# Patient Record
Sex: Male | Born: 1945 | Race: Asian | Hispanic: No | Marital: Single | Smoking: Never smoker
Health system: Southern US, Community
[De-identification: ages and names within clinical notes are randomized; demographics above are authoritative.]

---

## 2018-09-28 ENCOUNTER — Inpatient Hospital Stay
Admission: EM | Admit: 2018-09-28 | Discharge: 2018-09-30 | DRG: 247 | Disposition: A | Discharge status: Home / Self Care | Payer: PRIVATE HEALTH INSURANCE | Attending: Internal Medicine | Admitting: Internal Medicine

## 2018-09-28 ENCOUNTER — Inpatient Hospital Stay
Admit: 2018-09-28 | Discharge: 2018-09-28 | Disposition: A | Discharge status: Home / Self Care | Payer: PRIVATE HEALTH INSURANCE | Attending: Internal Medicine | Admitting: Internal Medicine

## 2018-09-28 ENCOUNTER — Emergency Department: Payer: PRIVATE HEALTH INSURANCE

## 2018-09-28 ENCOUNTER — Encounter: Payer: Self-pay | Admitting: Emergency Medicine

## 2018-09-28 ENCOUNTER — Other Ambulatory Visit: Payer: Self-pay

## 2018-09-28 DIAGNOSIS — I249 Acute ischemic heart disease, unspecified: Secondary | ICD-10-CM | POA: Diagnosis present

## 2018-09-28 DIAGNOSIS — E785 Hyperlipidemia, unspecified: Secondary | ICD-10-CM | POA: Diagnosis present

## 2018-09-28 DIAGNOSIS — I255 Ischemic cardiomyopathy: Secondary | ICD-10-CM | POA: Diagnosis present

## 2018-09-28 DIAGNOSIS — I160 Hypertensive urgency: Secondary | ICD-10-CM | POA: Diagnosis present

## 2018-09-28 DIAGNOSIS — I2129 ST elevation (STEMI) myocardial infarction involving other sites: Secondary | ICD-10-CM | POA: Diagnosis present

## 2018-09-28 DIAGNOSIS — Z79899 Other long term (current) drug therapy: Secondary | ICD-10-CM

## 2018-09-28 DIAGNOSIS — R079 Chest pain, unspecified: Secondary | ICD-10-CM | POA: Diagnosis present

## 2018-09-28 DIAGNOSIS — Z955 Presence of coronary angioplasty implant and graft: Secondary | ICD-10-CM | POA: Diagnosis not present

## 2018-09-28 DIAGNOSIS — I10 Essential (primary) hypertension: Secondary | ICD-10-CM | POA: Diagnosis present

## 2018-09-28 LAB — CBC WITH DIFFERENTIAL/PLATELET
Abs Immature Granulocytes: 0.09 10*3/uL — ABNORMAL HIGH (ref 0.00–0.07)
Basophils Absolute: 0.1 10*3/uL (ref 0.0–0.1)
Basophils Relative: 0 %
Eosinophils Absolute: 0.1 10*3/uL (ref 0.0–0.5)
Eosinophils Relative: 0 %
HCT: 48.5 % (ref 39.0–52.0)
Hemoglobin: 16.7 g/dL (ref 13.0–17.0)
Immature Granulocytes: 1 %
Lymphocytes Relative: 13 %
Lymphs Abs: 1.8 10*3/uL (ref 0.7–4.0)
MCH: 28.7 pg (ref 26.0–34.0)
MCHC: 34.4 g/dL (ref 30.0–36.0)
MCV: 83.5 fL (ref 80.0–100.0)
Monocytes Absolute: 1 10*3/uL (ref 0.1–1.0)
Monocytes Relative: 7 %
Neutro Abs: 10.7 10*3/uL — ABNORMAL HIGH (ref 1.7–7.7)
Neutrophils Relative %: 79 %
Platelets: 236 10*3/uL (ref 150–400)
RBC: 5.81 MIL/uL (ref 4.22–5.81)
RDW: 13.2 % (ref 11.5–15.5)
WBC: 13.7 10*3/uL — ABNORMAL HIGH (ref 4.0–10.5)
nRBC: 0 % (ref 0.0–0.2)

## 2018-09-28 LAB — LIPASE, BLOOD: Lipase: 30 U/L (ref 11–51)

## 2018-09-28 LAB — COMPREHENSIVE METABOLIC PANEL
ALT: 29 U/L (ref 0–44)
AST: 117 U/L — ABNORMAL HIGH (ref 15–41)
Albumin: 4.6 g/dL (ref 3.5–5.0)
Alkaline Phosphatase: 105 U/L (ref 38–126)
Anion gap: 12 (ref 5–15)
BUN: 12 mg/dL (ref 8–23)
CO2: 26 mmol/L (ref 22–32)
Calcium: 10 mg/dL (ref 8.9–10.3)
Chloride: 99 mmol/L (ref 98–111)
Creatinine, Ser: 0.99 mg/dL (ref 0.61–1.24)
GFR calc Af Amer: >60 mL/min (ref >60)
GFR calc non Af Amer: >60 mL/min (ref >60)
Glucose, Bld: 145 mg/dL — ABNORMAL HIGH (ref 70–99)
Potassium: 3.9 mmol/L (ref 3.5–5.1)
Sodium: 137 mmol/L (ref 135–145)
Total Bilirubin: 0.7 mg/dL (ref 0.3–1.2)
Total Protein: 9.5 g/dL — ABNORMAL HIGH (ref 6.5–8.1)

## 2018-09-28 LAB — LIPID PANEL
Cholesterol: 178 mg/dL (ref 0–200)
HDL: 33 mg/dL — ABNORMAL LOW (ref >40)
LDL Cholesterol: 129 mg/dL — ABNORMAL HIGH (ref 0–99)
Total CHOL/HDL Ratio: 5.4 RATIO
Triglycerides: 82 mg/dL (ref <150)
VLDL: 16 mg/dL (ref 0–40)

## 2018-09-28 LAB — TROPONIN I
Troponin I: 6.78 ng/mL (ref <0.03)
Troponin I: 7.81 ng/mL (ref <0.03)
Troponin I: 14.87 ng/mL (ref <0.03)

## 2018-09-28 LAB — TSH: TSH: 4.019 u[IU]/mL (ref 0.350–4.500)

## 2018-09-28 LAB — PROTIME-INR
INR: 1 (ref 0.8–1.2)
Prothrombin Time: 13.4 seconds (ref 11.4–15.2)

## 2018-09-28 LAB — HEPARIN LEVEL (UNFRACTIONATED): Heparin Unfractionated: 0.4 IU/mL (ref 0.30–0.70)

## 2018-09-28 LAB — APTT: aPTT: 32 seconds (ref 24–36)

## 2018-09-28 MED ORDER — LOSARTAN POTASSIUM 50 MG PO TABS
50.0000 mg | ORAL_TABLET | Freq: Every day | ORAL | Status: DC
  Administered 2018-09-30: 50 mg via ORAL
  Filled 2018-09-28: qty 1

## 2018-09-28 MED ORDER — SODIUM CHLORIDE 0.9% FLUSH: 3.0000 mL | INTRAVENOUS | Status: DC | PRN

## 2018-09-28 MED ORDER — SODIUM CHLORIDE 0.9 % IV BOLUS: 500.0000 mL | Freq: Once | INTRAVENOUS | Status: DC

## 2018-09-28 MED ORDER — ASPIRIN 81 MG PO CHEW
81.0000 mg | CHEWABLE_TABLET | ORAL | Status: AC
  Administered 2018-09-29: 81 mg via ORAL
  Filled 2018-09-28: qty 1

## 2018-09-28 MED ORDER — ALUM & MAG HYDROXIDE-SIMETH 200-200-20 MG/5ML PO SUSP
30.0000 mL | Freq: Four times a day (QID) | ORAL | Status: DC
  Administered 2018-09-28 – 2018-09-30 (×6): 30 mL via ORAL
  Filled 2018-09-28 (×6): qty 30

## 2018-09-28 MED ORDER — ONDANSETRON HCL 4 MG/2ML IJ SOLN
4.0000 mg | Freq: Four times a day (QID) | INTRAMUSCULAR | Status: DC | PRN
  Administered 2018-09-29: 4 mg via INTRAVENOUS
  Filled 2018-09-28: qty 2

## 2018-09-28 MED ORDER — METOPROLOL TARTRATE 50 MG PO TABS
100.0000 mg | ORAL_TABLET | Freq: Two times a day (BID) | ORAL | Status: DC
  Administered 2018-09-28 – 2018-09-30 (×3): 100 mg via ORAL
  Filled 2018-09-28 (×3): qty 2

## 2018-09-28 MED ORDER — METOPROLOL TARTRATE 25 MG PO TABS
25.0000 mg | ORAL_TABLET | Freq: Once | ORAL | Status: AC
  Administered 2018-09-28: 25 mg via ORAL
  Filled 2018-09-28: qty 1

## 2018-09-28 MED ORDER — HYDRALAZINE HCL 20 MG/ML IJ SOLN: 10.0000 mg | Freq: Four times a day (QID) | INTRAMUSCULAR | Status: DC | PRN

## 2018-09-28 MED ORDER — SODIUM CHLORIDE 0.9 % WEIGHT BASED INFUSION
1.0000 mL/kg/h | INTRAVENOUS | Status: DC
  Administered 2018-09-29: 1 mL/kg/h via INTRAVENOUS

## 2018-09-28 MED ORDER — PANTOPRAZOLE SODIUM 40 MG PO TBEC
40.0000 mg | DELAYED_RELEASE_TABLET | Freq: Every day | ORAL | Status: DC
  Administered 2018-09-28 – 2018-09-30 (×2): 40 mg via ORAL
  Filled 2018-09-28 (×2): qty 1

## 2018-09-28 MED ORDER — SODIUM CHLORIDE 0.9 % WEIGHT BASED INFUSION
3.0000 mL/kg/h | INTRAVENOUS | Status: DC
  Administered 2018-09-29: 3 mL/kg/h via INTRAVENOUS

## 2018-09-28 MED ORDER — ONDANSETRON HCL 4 MG/2ML IJ SOLN
4.0000 mg | Freq: Once | INTRAMUSCULAR | Status: AC
  Administered 2018-09-28: 4 mg via INTRAVENOUS
  Filled 2018-09-28: qty 2

## 2018-09-28 MED ORDER — LABETALOL HCL 5 MG/ML IV SOLN
10.0000 mg | Freq: Once | INTRAVENOUS | Status: AC
  Administered 2018-09-28: 14:00:00 10 mg via INTRAVENOUS
  Filled 2018-09-28: qty 4

## 2018-09-28 MED ORDER — ASPIRIN EC 81 MG PO TBEC
81.0000 mg | DELAYED_RELEASE_TABLET | Freq: Every day | ORAL | Status: DC
  Administered 2018-09-30: 81 mg via ORAL
  Filled 2018-09-28: qty 1

## 2018-09-28 MED ORDER — NITROGLYCERIN 0.4 MG SL SUBL
0.4000 mg | SUBLINGUAL_TABLET | SUBLINGUAL | Status: DC | PRN
  Administered 2018-09-29: 0.4 mg via SUBLINGUAL
  Filled 2018-09-28: qty 1

## 2018-09-28 MED ORDER — SODIUM CHLORIDE 0.9 % IV SOLN: 250.0000 mL | INTRAVENOUS | Status: DC | PRN

## 2018-09-28 MED ORDER — HEPARIN (PORCINE) 25000 UT/250ML-% IV SOLN
650.0000 [IU]/h | INTRAVENOUS | Status: DC
  Administered 2018-09-28: 650 [IU]/h via INTRAVENOUS
  Filled 2018-09-28: qty 250

## 2018-09-28 MED ORDER — ASPIRIN 81 MG PO CHEW
324.0000 mg | CHEWABLE_TABLET | Freq: Once | ORAL | Status: AC
  Administered 2018-09-28: 324 mg via ORAL
  Filled 2018-09-28: qty 4

## 2018-09-28 MED ORDER — SODIUM CHLORIDE 0.9% FLUSH: 3.0000 mL | Freq: Two times a day (BID) | INTRAVENOUS | Status: DC

## 2018-09-28 MED ORDER — HEPARIN BOLUS VIA INFUSION
4000.0000 [IU] | Freq: Once | INTRAVENOUS | Status: AC
  Administered 2018-09-28: 16:00:00 4000 [IU] via INTRAVENOUS
  Filled 2018-09-28: qty 4000

## 2018-09-28 MED ORDER — ACETAMINOPHEN 325 MG PO TABS
650.0000 mg | ORAL_TABLET | ORAL | Status: DC | PRN
  Administered 2018-09-29: 21:00:00 650 mg via ORAL
  Filled 2018-09-28: qty 2

## 2018-09-28 MED ORDER — ATORVASTATIN CALCIUM 20 MG PO TABS
80.0000 mg | ORAL_TABLET | Freq: Every day | ORAL | Status: DC
  Administered 2018-09-28 – 2018-09-29 (×2): 80 mg via ORAL
  Filled 2018-09-28 (×2): qty 4

## 2018-09-28 NOTE — ED Triage Notes (Signed)
Pt arrives with complaints of epigastric pain that started last night. Pt reports the pain as a burning sensation. Pt received a GI cocktail as fast med which reports relieved "some of the pain.'" Fast med sent pt to be evaluated in the ED due to abnormal EKG.

## 2018-09-28 NOTE — ED Notes (Signed)
CODE  STEMI  CNL PER DR  PARASCHOS  MD

## 2018-09-28 NOTE — ED Notes (Signed)
Code stemi  Called  To [facility] 

## 2018-09-28 NOTE — ED Notes (Signed)
Pt assisted to wheelchair upon arrival; pt came from [facility] after being seen there for indigestion; provider called to say pt had an abnormal EKG and was coming for further evaluation

## 2018-09-28 NOTE — Progress Notes (Signed)
   09/28/18 1400  Clinical Encounter Type  Visited With Patient not available;Health care provider  Visit Type Code  Referral From Nurse   Chaplain received a page for a code STEMI to the patient's room. Upon arrival, the cardiologist was assessing the patient and talking with the patient's son. The chaplain offered silent prayer for the patient and his health.While waiting, the code STEMI was called off.

## 2018-09-28 NOTE — ED Notes (Addendum)
Son's ph [phone]. Pt does not speak [place] and interpreter needed. Son called to complete triage. Per son the Pt's symptoms began around lunch time yesterday stating that he was "not good". At approx 3 pm pt took Digene ([place] Medication for "stomach issues"). Pt vomiting anything that he attempts to eat or drink. Pt woke at 3 am this morning with c/o of chest/stomach pain and vomiting. At 8 am pt given omeprazole (20mg ) and breakfast but patient vomited. Pt taken to urgent care prior to ED and given GI cocktail and sent to ED due to abnormal EKG. Upon arrival EKG indicates STEMI. Per son Pt has no cardiac hx or other medical concerns.

## 2018-09-28 NOTE — Progress Notes (Signed)
*  PRELIMINARY RESULTS* Echocardiogram 2D Echocardiogram has been performed.  Joanette Gula Nile Dorning 09/28/2018, 4:44 PM

## 2018-09-28 NOTE — Consult Note (Signed)
ANTICOAGULATION CONSULT NOTE - Initial Consult  Pharmacy Consult for Heparin Drip Indication: chest pain/ACS  Not on File  Patient Measurements: Height: 5\' 2"  (157.5 cm) Weight: 120 lb (54.4 kg) IBW/kg (Calculated) : 54.6 Heparin Dosing Weight: 54.4kg  Vital Signs: Temp: 97.3 F (36.3 C) (04/08 1244) Temp Source: Oral (04/08 1244) BP: 198/105 (04/08 1315) Pulse Rate: 113 (04/08 1315)  Labs: Recent Labs    09/28/18 1251  HGB 16.7  HCT 48.5  PLT 236  APTT 32  LABPROT 13.4  INR 1.0    CrCl cannot be calculated (No successful lab value found.).   Medical History: History reviewed. No pertinent past medical history.  Medications:  No PTA medications.    Pt received labetalol 10mg  IV x 1, metoprolol 25mg , and ASA 324mg  in ED.  Assessment: Pharmacy has been consulted for heparin drip in this 73 yo male with ACS/STEMI.   Baseline INR, APTT, and CBC have been obtained.  Goal of Therapy:  Heparin level 0.3-0.7 units/ml Monitor platelets by anticoagulation protocol: Yes   Plan:  Will dose 4000 units of heparin, followed by 650 units/hr.  Will recheck HL @ 2200 (8 hours) per protocol.   Will monitor CBC's daily while on heparin drip.  Albina Billet, PharmD, BCPS Clinical Pharmacist 09/28/2018 1:49 PM

## 2018-09-28 NOTE — ED Notes (Addendum)
Dr Darrold Junker at bedside to speak with pt using interpreter pad. Pt's son brought to room to assist with interpreting and to determine series of events leading to pt seeking treatment. Per son the Pt's symptoms have been ongoing for approx 17 hrs. Pt denies any chest pain at this time. Pt will not be transferred to cath lab at this time and will be scheduled for tomorrow morning due to pt being asymptomatic since 4 am.

## 2018-09-28 NOTE — ED Notes (Addendum)
ED TO INPATIENT HANDOFF REPORT  ED Nurse Name and Phone #: Selena BattenKim [phone]  S Name/Age/Gender Duane Reeves 73 y.o. male Room/Bed: ED17A/ED17A  Code Status   Code Status: Not on file  Home/SNF/Other Home Patient oriented to: self, place, time and situation Is this baseline? Yes   Triage Complete: Triage complete  Chief Complaint indigestion  Triage Note Pt arrives with complaints of epigastric pain that started last night. Pt reports the pain as a burning sensation. Pt received a GI cocktail as fast med which reports relieved "some of the pain.'" Fast med sent pt to be evaluated in the ED due to abnormal EKG.    Allergies Not on File  Level of Care/Admitting Diagnosis ED Disposition    ED Disposition Condition Comment   Admit  [facility]ospital [facility] [[id number]]  Level of Care: Telemetry [5]  Diagnosis: Chest pain [[id number]][[id number]]  Admitting Physician: [place] BilberryEL, SHREYANG [[id number]]  Attending Physician: [place] BilberryPATEL, SHREYANG [[id number]]  Estimated length of stay: past midnight tomorrow  Certification:: I certify this patient will need inpatient services for at least 2 midnights  PT Class (Do Not Modify): Inpatient [101]  PT Acc Code (Do Not Modify): Private [1]       B Medical/Surgery History History reviewed. No pertinent past medical history. History reviewed. No pertinent surgical history.   A IV Location/Drains/Wounds Patient Lines/Drains/Airways Status   Active Line/Drains/Airways    Name:   Placement date:   Placement time:   Site:   Days:   Peripheral IV 09/28/18 Left Antecubital   09/28/18    1246    Antecubital   less than 1          Intake/Output Last 24 hours No intake or output data in the 24 hours ending 09/28/18 1410  Labs/Imaging Results for orders placed or performed during the hospital encounter of 09/28/18 (from the past 48 hour(s))  Comprehensive metabolic panel     Status: Abnormal   Collection Time: 09/28/18 12:51 PM   Result Value Ref Range   Sodium 137 135 - 145 mmol/L   Potassium 3.9 3.5 - 5.1 mmol/L   Chloride 99 98 - 111 mmol/L   CO2 26 22 - 32 mmol/L   Glucose, Bld 145 (H) 70 - 99 mg/dL   BUN 12 8 - 23 mg/dL   Creatinine, Ser 2.950.99 0.61 - 1.24 mg/dL   Calcium 28.410.0 8.9 - 13.210.3 mg/dL   Total Protein 9.5 (H) 6.5 - 8.1 g/dL   Albumin 4.6 3.5 - 5.0 g/dL   AST 440117 (H) 15 - 41 U/L   ALT 29 0 - 44 U/L   Alkaline Phosphatase 105 38 - 126 U/L   Total Bilirubin 0.7 0.3 - 1.2 mg/dL   GFR calc non Af Amer >60 >60 mL/min   GFR calc Af Amer >60 >60 mL/min   Anion gap 12 5 - 15    Comment: Performed at [facility], [address] [place], [place] [address]  Lipase, blood     Status: None   Collection Time: 09/28/18 12:51 PM  Result Value Ref Range   Lipase 30 11 - 51 U/L    Comment: Performed at [facility], [address] [place], [place] [address]  Troponin I - Once     Status: Abnormal   Collection Time: 09/28/18 12:51 PM  Result Value Ref Range   Troponin I 6.78 (HH) <0.03 ng/mL    Comment: CRITICAL RESULT CALLED TO, READ BACK BY AND  VERIFIED WITH KIM Jathen Sudano @1355  09/28/2018 MU/HKP Performed at [facility], [address] [place], [place] [address]   CBC with Differential     Status: Abnormal   Collection Time: 09/28/18 12:51 PM  Result Value Ref Range   WBC 13.7 (H) 4.0 - 10.5 K/uL   RBC 5.81 4.22 - 5.81 MIL/uL   Hemoglobin 16.7 13.0 - 17.0 g/dL   HCT 93.9 03.0 - 09.2 %   MCV 83.5 80.0 - 100.0 fL   MCH 28.7 26.0 - 34.0 pg   MCHC 34.4 30.0 - 36.0 g/dL   RDW 33.0 07.6 - 22.6 %   Platelets 236 150 - 400 K/uL   nRBC 0.0 0.0 - 0.2 %   Neutrophils Relative % 79 %   Neutro Abs 10.7 (H) 1.7 - 7.7 K/uL   Lymphocytes Relative 13 %   Lymphs Abs 1.8 0.7 - 4.0 K/uL   Monocytes Relative 7 %   Monocytes Absolute 1.0 0.1 - 1.0 K/uL   Eosinophils Relative 0 %   Eosinophils Absolute 0.1 0.0 - 0.5 K/uL   Basophils Relative 0 %   Basophils Absolute 0.1 0.0 - 0.1 K/uL    Immature Granulocytes 1 %   Abs Immature Granulocytes 0.09 (H) 0.00 - 0.07 K/uL    Comment: Performed at [facility], [address] [place], [place] [address]  Protime-INR     Status: None   Collection Time: 09/28/18 12:51 PM  Result Value Ref Range   Prothrombin Time 13.4 11.4 - 15.2 seconds   INR 1.0 0.8 - 1.2    Comment: (NOTE) INR goal varies based on device and disease states. Performed at [facility], [address] [place], [place] [address]   APTT     Status: None   Collection Time: 09/28/18 12:51 PM  Result Value Ref Range   aPTT 32 24 - 36 seconds    Comment: Performed at [facility], [address] [place], [place] [address]   Dg Chest Portable 1 View  Result Date: 09/28/2018 CLINICAL DATA:  Chest pain, STEMI EXAM: PORTABLE CHEST 1 VIEW COMPARISON:  None. FINDINGS: Cardiac shadows within normal limits. The lungs are well aerated bilaterally. No focal infiltrate or effusion is seen. No acute bony abnormality is noted. IMPRESSION: No active disease. Electronically Signed   By: Alcide Clever M.D.   On: 09/28/2018 13:43    Pending Labs Unresulted Labs (From admission, onward)    Start     Ordered   09/29/18 0500  CBC  Daily,   STAT    Comments:  CBC's daily while on heparin    09/28/18 1350   09/28/18 2200  Heparin level (unfractionated)  Once-Timed,   STAT     09/28/18 1340   Signed and Held  TSH  Once,   R     Signed and Held   Signed and Held  Lipid panel  Tomorrow morning,   R     Signed and Held          Vitals/Pain Today's Vitals   09/28/18 1257 09/28/18 1315 09/28/18 1330 09/28/18 1345  BP: (!) 174/101 (!) 198/105 (!) 166/105 (!) 156/95  Pulse: (!) 107 (!) 113 (!) 105 97  Resp: (!) 23 (!) 22 (!) 24 18  Temp:      TempSrc:      SpO2: 98% 98% 98% 98%  Weight:      Height:      PainSc:  Isolation Precautions No active isolations  Medications Medications  sodium chloride 0.9 % bolus 500 mL (has no  administration in time range)  heparin bolus via infusion 4,000 Units (has no administration in time range)    Followed by  heparin ADULT infusion 100 units/mL (25000 units/253mL sodium chloride 0.45%) (has no administration in time range)  alum & mag hydroxide-simeth (MAALOX/MYLANTA) 200-200-20 MG/5ML suspension 30 mL (has no administration in time range)  pantoprazole (PROTONIX) EC tablet 40 mg (has no administration in time range)  ondansetron (ZOFRAN) injection 4 mg (4 mg Intravenous Given 09/28/18 1330)  metoprolol tartrate (LOPRESSOR) tablet 25 mg (25 mg Oral Given 09/28/18 1340)  aspirin chewable tablet 324 mg (324 mg Oral Given 09/28/18 1340)  labetalol (NORMODYNE,TRANDATE) injection 10 mg (10 mg Intravenous Given 09/28/18 1340)    Mobility walks Low fall risk   Focused Assessments Cardiac Assessment Handoff:  Cardiac Rhythm: Sinus tachycardia Lab Results  Component Value Date   TROPONINI 6.78 (HH) 09/28/2018   No results found for: DDIMER Does the Patient currently have chest pain? No     R Recommendations: See Admitting Provider Note  Report given to:   Additional Notes:  Pt to be sent to cath lab in the morning per Dr. Darrold Junker.

## 2018-09-28 NOTE — H&P (Signed)
[employer] -  at [facility]   PATIENT NAME: Beka Mikulak    MR#:  [id number]  DATE OF BIRTH:  13-Oct-1945  DATE OF ADMISSION:  09/28/2018  PRIMARY CARE PHYSICIAN: Patient, No Pcp Per   REQUESTING/REFERRING PHYSICIAN: Alfonse Flavors, MD  CHIEF COMPLAINT:   Chief Complaint  Patient presents with  . Chest Pain    HISTORY OF PRESENT ILLNESS: Villa Hollrah  is a 73 y.o. male with no known medical history was presenting to the hospital with complaint of substernal burning in his chest.  Patient son states that he has not been feeling well with some heartburn and been having gassy sensation.  Patient's symptoms continue to got worse therefore he went to urgent care he was referred to the hospital for further evaluation.  Initially in the emergency room the ED physician called a code STEMI however he was seen by cardiology who felt that this was not a code STEMI.  Patient's blood pressure is very high in the emergency room.  Currently has his symptoms have resolved.  Patient states that he is healthy and not taking any medications.  Did not have any chest pain per se.  Did not have any radiation of the pain or shortness of breath.     PAST MEDICAL HISTORY:  History reviewed. No pertinent past medical history.  PAST SURGICAL HISTORY: History reviewed. No pertinent surgical history.  SOCIAL HISTORY:  Social History   Tobacco Use  . Smoking status: Never Smoker  . Smokeless tobacco: Never Used  Substance Use Topics  . Alcohol use: Not Currently    FAMILY HISTORY: History reviewed. No pertinent family history.  DRUG ALLERGIES: Not on File  REVIEW OF SYSTEMS:   CONSTITUTIONAL: No fever, fatigue or weakness.  EYES: No blurred or double vision.  EARS, NOSE, AND THROAT: No tinnitus or ear pain.  RESPIRATORY: No cough, shortness of breath, wheezing or hemoptysis.  CARDIOVASCULAR: No chest pain, orthopnea, edema.  Substernal burning  sensation GASTROINTESTINAL: Positive nausea, positive vomiting, no diarrhea or abdominal pain.  GENITOURINARY: No dysuria, hematuria.  ENDOCRINE: No polyuria, nocturia,  HEMATOLOGY: No anemia, easy bruising or bleeding SKIN: No rash or lesion. MUSCULOSKELETAL: No joint pain or arthritis.   NEUROLOGIC: No tingling, numbness, weakness.  PSYCHIATRY: No anxiety or depression.   MEDICATIONS AT HOME:  Prior to Admission medications   Not on File      PHYSICAL EXAMINATION:   VITAL SIGNS: Blood pressure (!) 156/95, pulse 97, temperature (!) 97.3 F (36.3 C), temperature source Oral, resp. rate 18, height 5\' 2"  (1.575 m), weight 54.4 kg, SpO2 98 %.  GENERAL:  73 y.o.-year-old patient lying in the bed with no acute distress.  EYES: Pupils equal, round, reactive to light and accommodation. No scleral icterus. Extraocular muscles intact.  HEENT: Head atraumatic, normocephalic. Oropharynx and nasopharynx clear.  NECK:  Supple, no jugular venous distention. No thyroid enlargement, no tenderness.  LUNGS: Normal breath sounds bilaterally, no wheezing, rales,rhonchi or crepitation. No use of accessory muscles of respiration.  CARDIOVASCULAR: S1, S2 normal. No murmurs, rubs, or gallops.  ABDOMEN: Soft, nontender, nondistended. Bowel sounds present. No organomegaly or mass.  EXTREMITIES: No pedal edema, cyanosis, or clubbing.  NEUROLOGIC: Cranial nerves II through XII are intact. Muscle strength 5/5 in all extremities. Sensation intact. Gait not checked.  PSYCHIATRIC: The patient is alert and oriented x 3.  SKIN: No obvious rash, lesion, or ulcer.   LABORATORY PANEL:   CBC Recent Labs  Lab 09/28/18 1251  WBC 13.7*  HGB 16.7  HCT 48.5  PLT 236  MCV 83.5  MCH 28.7  MCHC 34.4  RDW 13.2  LYMPHSABS 1.8  MONOABS 1.0  EOSABS 0.1  BASOSABS 0.1   ------------------------------------------------------------------------------------------------------------------  Chemistries  No results for  input(s): NA, K, CL, CO2, GLUCOSE, BUN, CREATININE, CALCIUM, MG, AST, ALT, ALKPHOS, BILITOT in the last 168 hours.  Invalid input(s): GFRCGP ------------------------------------------------------------------------------------------------------------------ CrCl cannot be calculated (No successful lab value found.). ------------------------------------------------------------------------------------------------------------------ No results for input(s): TSH, T4TOTAL, T3FREE, THYROIDAB in the last 72 hours.  Invalid input(s): FREET3   Coagulation profile Recent Labs  Lab 09/28/18 1251  INR 1.0   ------------------------------------------------------------------------------------------------------------------- No results for input(s): DDIMER in the last 72 hours. -------------------------------------------------------------------------------------------------------------------  Cardiac Enzymes No results for input(s): CKMB, TROPONINI, MYOGLOBIN in the last 168 hours.  Invalid input(s): CK ------------------------------------------------------------------------------------------------------------------ Invalid input(s): POCBNP  ---------------------------------------------------------------------------------------------------------------  Urinalysis No results found for: COLORURINE, APPEARANCEUR, LABSPEC, PHURINE, GLUCOSEU, HGBUR, BILIRUBINUR, KETONESUR, PROTEINUR, UROBILINOGEN, NITRITE, LEUKOCYTESUR   RADIOLOGY: Dg Chest Portable 1 View  Result Date: 09/28/2018 CLINICAL DATA:  Chest pain, STEMI EXAM: PORTABLE CHEST 1 VIEW COMPARISON:  None. FINDINGS: Cardiac shadows within normal limits. The lungs are well aerated bilaterally. No focal infiltrate or effusion is seen. No acute bony abnormality is noted. IMPRESSION: No active disease. Electronically Signed   By: Alcide CleverMark  Lukens M.D.   On: 09/28/2018 13:43    EKG: Orders placed or performed during the hospital encounter of 09/28/18  .  EKG 12-Lead  . EKG 12-Lead    IMPRESSION AND PLAN: Patient is 73 year old presenting with substernal burning sensation  1.  Chest pain possible cardiac in nature could be GI related Cardiac enzymes pending Continue heparin drip Cardiology has seen the patient Start patient on metoprolol Start patient on Lipitor Follow troponin level Check lipid panel in the morning   2.  Accelerated hypertension we will start patient on metoprolol I will use IV hydralazine as needed  3.  Substernal burning could be related to reflux we will try Maalox and Protonix  4.  Miscellaneous heparin for DVT prophylaxis     All the records are reviewed and case discussed with ED provider. Management plans discussed with the patient, family and they are in agreement.  CODE STATUS:full    TOTAL TIME TAKING CARE OF THIS PATIENT: 55minutes.    [place] BilberryShreyang Neve Branscomb M.D on 09/28/2018 at 1:53 PM  Between 7am to 6pm - Pager - [phone]  After 6pm go to [email or web address] - password [email or web address] [facility]  [employer] Office  [phone]  CC: Primary care physician; Patient, No Pcp Per

## 2018-09-28 NOTE — ED Provider Notes (Addendum)
[facility] Emergency Department Provider Note  ____________________________________________  Time seen: Approximately 1:13 PM  I have reviewed the triage vital signs and the nursing notes.   HISTORY  Chief Complaint Chest Pain  Hindi  video interpreter used for encounter Additional history obtained by phone from son  HPI Duane Reeves is a 73 y.o. male with no known past medical history except for GERD who comes the ED complaining of chest pain earlier today.  He was awakened at 3 AM with chest pain which lasted until 6 AM.  Nonradiating, no aggravating or alleviating factors, not exertional or pleuritic, no associated shortness of breath diaphoresis or vomiting but he did have nausea.  Son reports that the patient has had off and on "stomach" pain and "feeling bad" over the past 24 hours since lunchtime yesterday.  They have been giving different antacid medications.  Denies a history of heart attack or CAD.   Patient confirms he is currently chest pain-free and has been for the past several hours.   History reviewed. No pertinent past medical history.   There are no active problems to display for this patient.    History reviewed. No pertinent surgical history.   Prior to Admission medications   Not on File  Omeprazole as needed   Allergies Patient has no allergy information on record.   History reviewed. No pertinent family history.  Social History Social History   Tobacco Use  . Smoking status: Never Smoker  . Smokeless tobacco: Never Used  Substance Use Topics  . Alcohol use: Not Currently  . Drug use: Not on file    Review of Systems  Constitutional:   No fever or chills.  ENT:   No sore throat. No rhinorrhea. Cardiovascular:   Positive as above chest pain without syncope. Respiratory:   No dyspnea or cough. Gastrointestinal:   Negative for abdominal pain, vomiting and diarrhea.  Musculoskeletal:   Negative  for focal pain or swelling All other systems reviewed and are negative except as documented above in ROS and HPI.  ____________________________________________   PHYSICAL EXAM:  VITAL SIGNS: ED Triage Vitals  Enc Vitals Group     BP 09/28/18 1257 (!) 174/101     Pulse Rate 09/28/18 1244 (!) 112     Resp 09/28/18 1244 15     Temp 09/28/18 1244 (!) 97.3 F (36.3 C)     Temp Source 09/28/18 1244 Oral     SpO2 09/28/18 1244 100 %     Weight 09/28/18 1244 120 lb (54.4 kg)     Height 09/28/18 1244  (1.575 m)     Head Circumference --      Peak Flow --      Pain Score 09/28/18 1244 8     Pain Loc --      Pain Edu? --      Excl. in GC? --     Vital signs reviewed, nursing assessments reviewed.   Constitutional:   Alert and oriented. Non-toxic appearance. Eyes:   Conjunctivae are normal. EOMI. PERRL. ENT      Head:   Normocephalic and atraumatic.      Nose:   No congestion/rhinnorhea.       Mouth/Throat:   MMM, no pharyngeal erythema. No peritonsillar mass.       Neck:   No meningismus. Full ROM. Hematological/Lymphatic/Immunilogical:   No cervical lymphadenopathy. Cardiovascular:   Tachycardia heart rate 105. Symmetric bilateral radial and DP pulses.  No murmurs.  Cap refill less than 2 seconds. Respiratory:   Normal respiratory effort without tachypnea/retractions. Breath sounds are clear and equal bilaterally. No wheezes/rales/rhonchi. Gastrointestinal:   Soft and nontender. Non distended. There is no CVA tenderness.  No rebound, rigidity, or guarding. Musculoskeletal:   Normal range of motion in all extremities. No joint effusions.  No lower extremity tenderness.  No edema. Neurologic:   Normal speech and language.  Motor grossly intact. No acute focal neurologic deficits are appreciated.  Skin:    Skin is warm, dry and intact. No rash noted.  No petechiae, purpura, or bullae.  ____________________________________________    LABS (pertinent  positives/negatives) (all labs ordered are listed, but only abnormal results are displayed) Labs Reviewed  COMPREHENSIVE METABOLIC PANEL  LIPASE, BLOOD  TROPONIN I  CBC WITH DIFFERENTIAL/PLATELET  PROTIME-INR  APTT   ____________________________________________   EKG  Interpreted by me Sinus tachycardia rate 103.  Normal axis and intervals.  Normal QRS.  ST elevation in V2 through V5 consistent with STEMI.  No ST depressions or reciprocal changes.  Anterior Q waves present.  ____________________________________________    RADIOLOGY  No results found.  ____________________________________________   PROCEDURES .Critical Care Performed by: Sharman CheekStafford, Catelyn Friel, MD Authorized by: Sharman CheekStafford, Olanrewaju Osborn, MD   Critical care provider statement:    Critical care time (minutes):  30   Critical care time was exclusive of:  Separately billable procedures and treating other patients   Critical care was necessary to treat or prevent imminent or life-threatening deterioration of the following conditions:  Cardiac failure   Critical care was time spent personally by me on the following activities:  Development of treatment plan with patient or surrogate, discussions with consultants, evaluation of patient's response to treatment, examination of patient, obtaining history from patient or surrogate, ordering and performing treatments and interventions, ordering and review of laboratory studies, ordering and review of radiographic studies, pulse oximetry, re-evaluation of patient's condition and review of old charts    ____________________________________________  DIFFERENTIAL DIAGNOSIS   STEMI, GERD, doubt PE dissection AAA pneumothorax or pneumonia  CLINICAL IMPRESSION / ASSESSMENT AND PLAN / ED COURSE  Medications ordered in the ED: Medications  sodium chloride 0.9 % bolus 500 mL (has no administration in time range)  metoprolol tartrate (LOPRESSOR) tablet 25 mg (has no administration in  time range)  aspirin chewable tablet 324 mg (has no administration in time range)  labetalol (NORMODYNE,TRANDATE) injection 10 mg (has no administration in time range)  ondansetron (ZOFRAN) injection 4 mg (4 mg Intravenous Given 09/28/18 1330)    Pertinent labs & imaging results that were available during my care of the patient were reviewed by me and considered in my medical decision making (see chart for details).  Candace GallusKrishna Balaram Singh Seger was evaluated in Emergency Department on 09/28/2018 for the symptoms described in the history of present illness. He was evaluated in the context of the global COVID-19 pandemic, which necessitated consideration that the patient might be at risk for infection with the SARS-CoV-2 virus that causes COVID-19. Institutional protocols and algorithms that pertain to the evaluation of patients at risk for COVID-19 are in a state of rapid change based on information released by regulatory bodies including the CDC and federal and state organizations. These policies and algorithms were followed during the patient's care in the ED.   Patient presents with intermittent chest pain over the last 24 hours, last episode from 3 AM to 6 AM today.  EKG concerning for acute ischemia.    Clinical Course  as of Sep 28 1354  Wed Sep 28, 2018  1312 Cardiology Dr. Darrold Junker at bedside with Hindi video interpreter   [PS]  [phone] Cardiology counsels code STEMI for now due to nearly 18 hours of symptoms but chest pain-free for the past 7 hours.  Recommends heparin bolus and infusion, stat echo, oral metoprolol and hospitalization for further cardiac monitoring and work-up.   [PS]  1355 Notified by lab of troponin of 6.  Continue current management.  Blood pressure improved to 160/95   [PS]    Clinical Course User Index [PS] Sharman Cheek, MD    ----------------------------------------- 1:33 PM on 09/28/2018 -----------------------------------------  Discussed with  hospitalist.   ____________________________________________   FINAL CLINICAL IMPRESSION(S) / ED DIAGNOSES    Final diagnoses:  Acute coronary syndrome [facility])     ED Discharge Orders    None      Portions of this note were generated with dragon dictation software. Dictation errors may occur despite best attempts at proofreading.   Sharman Cheek, MD 09/28/18 1333    Sharman Cheek, MD 09/28/18 1356

## 2018-09-28 NOTE — Consult Note (Signed)
[facility] Cardiology  CARDIOLOGY CONSULT NOTE  Patient ID: Duane Reeves MRN: [id number] DOB/AGE: [birth year] 73 y.o.  Admit date: 09/28/2018 Referring Physician [facility] Primary Physician  Primary Cardiologist  Reason for Consultation anterolateral ST elevation myocardial infarction  HPI: 73 year old [place] gentleman referred for evaluation of anterolateral ST elevation myocardial infarction.  History obtained by video interpreter and son.  The patient apparently was in his usual state of health until recently when he did not feel well.  At 8 PM on 09/27/2018 the patient experienced heartburn and was treated with antacids are short relief after vomiting.  The patient had recurrent heartburn at 3 AM 09/28/2018 resultant vomiting.  During these episodes, the patient did experience heart pounding without actual chest pain or shortness of breath.  The patient has had no symptoms since 6 AM 09/28/2018.  The patient was brought to care evaluation where ECG was noted to be abnormal and was sent to [facility] ED.  An ECG revealed sinus rhythm with ST elevation with diagnostic Q waves in leads V2 through V5.  Patient currently denies chest pain.  Blood pressure is elevated 190/100 with heart rate of 100 bpm.  Patient has been symptom-free for over 7 hours.  Review of systems complete and found to be negative unless listed above     History reviewed. No pertinent past medical history.  History reviewed. No pertinent surgical history.  (Not in a hospital admission)  Social History   Socioeconomic History  . Marital status: Single    Spouse name: Not on file  . Number of children: Not on file  . Years of education: Not on file  . Highest education level: Not on file  Occupational History  . Not on file  Social Needs  . Financial resource strain: Not on file  . Food insecurity:    Worry: Not on file    Inability: Not on file  . Transportation needs:    Medical: Not on file    Non-medical: Not on  file  Tobacco Use  . Smoking status: Never Smoker  . Smokeless tobacco: Never Used  Substance and Sexual Activity  . Alcohol use: Not Currently  . Drug use: Not on file  . Sexual activity: Not on file  Lifestyle  . Physical activity:    Days per week: Not on file    Minutes per session: Not on file  . Stress: Not on file  Relationships  . Social connections:    Talks on phone: Not on file    Gets together: Not on file    Attends religious service: Not on file    Active member of club or organization: Not on file    Attends meetings of clubs or organizations: Not on file    Relationship status: Not on file  . Intimate partner violence:    Fear of current or ex partner: Not on file    Emotionally abused: Not on file    Physically abused: Not on file    Forced sexual activity: Not on file  Other Topics Concern  . Not on file  Social History Narrative  . Not on file    History reviewed. No pertinent family history.    Review of systems complete and found to be negative unless listed above      PHYSICAL EXAM  General: Well developed, well nourished, in no acute distress HEENT:  Normocephalic and atramatic Neck:  No JVD.  Lungs: Clear bilaterally to auscultation and percussion. Heart: HRRR .  Normal S1 and S2 without gallops or murmurs.  Abdomen: Bowel sounds are positive, abdomen soft and non-tender  Msk:  Back normal, normal gait. Normal strength and tone for age. Extremities: No clubbing, cyanosis or edema.   Neuro: Alert and oriented X 3. Psych:  Good affect, responds appropriately  Labs:   Lab Results  Component Value Date   WBC 13.7 (H) 09/28/2018   HGB 16.7 09/28/2018   HCT 48.5 09/28/2018   MCV 83.5 09/28/2018   PLT 236 09/28/2018   No results for input(s): NA, K, CL, CO2, BUN, CREATININE, CALCIUM, PROT, BILITOT, ALKPHOS, ALT, AST, GLUCOSE in the last 168 hours.  Invalid input(s): LABALBU No results found for: CKTOTAL, CKMB, CKMBINDEX, TROPONINI No  results found for: CHOL No results found for: HDL No results found for: LDLCALC No results found for: TRIG No results found for: CHOLHDL No results found for: LDLDIRECT    Radiology: No results found.  EKG: Sinus rhythm with evolving anterolateral ST elevation myocardial infarction  ASSESSMENT AND PLAN:   1.  Anterolateral ST elevation myocardial infarction, onset likely 18 hours prior to presentation, been symptom-free for 7 hours, currently denies chest pain  Recommendations  1.  Heparin bolus and drip 2.  Aspirin 81 mg daily 3.  Metoprolol tartrate 25 to 50 mg every 12 h 4.  2D echocardiogram 5.  Cycle troponin 6.  Defer code STEMI and emergent cardiac catheterization at this time 7.  Likely cardiac catheterization in a.m.  Signed: Marcina MillardAlexander Shahiem Bedwell MD,PhD, [facility] CenterFACC 09/28/2018, 1:40 PM

## 2018-09-28 NOTE — Progress Notes (Signed)
Lab reports troponin of 14.87. Pt has no chest pain or concerns at this time. MD made aware, no new orders. Pt continues on heparin drip.

## 2018-09-29 ENCOUNTER — Encounter
Admission: EM | Disposition: A | Discharge status: Home / Self Care | Payer: Self-pay | Source: Home / Self Care | Attending: Internal Medicine

## 2018-09-29 DIAGNOSIS — Z955 Presence of coronary angioplasty implant and graft: Secondary | ICD-10-CM

## 2018-09-29 HISTORY — PX: LEFT HEART CATH: CATH118248

## 2018-09-29 HISTORY — PX: CORONARY STENT INTERVENTION: CATH118234

## 2018-09-29 LAB — CBC
HCT: 47.1 % (ref 39.0–52.0)
Hemoglobin: 15.7 g/dL (ref 13.0–17.0)
MCH: 28 pg (ref 26.0–34.0)
MCHC: 33.3 g/dL (ref 30.0–36.0)
MCV: 84.1 fL (ref 80.0–100.0)
Platelets: 207 10*3/uL (ref 150–400)
RBC: 5.6 MIL/uL (ref 4.22–5.81)
RDW: 13.2 % (ref 11.5–15.5)
WBC: 13.8 10*3/uL — ABNORMAL HIGH (ref 4.0–10.5)
nRBC: 0 % (ref 0.0–0.2)

## 2018-09-29 LAB — ECHOCARDIOGRAM COMPLETE
Height: 62 in
Weight: 1920 oz

## 2018-09-29 LAB — BASIC METABOLIC PANEL
Anion gap: 13 (ref 5–15)
BUN: 14 mg/dL (ref 8–23)
CO2: 24 mmol/L (ref 22–32)
Calcium: 9.2 mg/dL (ref 8.9–10.3)
Chloride: 99 mmol/L (ref 98–111)
Creatinine, Ser: 1.01 mg/dL (ref 0.61–1.24)
GFR calc Af Amer: >60 mL/min (ref >60)
GFR calc non Af Amer: >60 mL/min (ref >60)
Glucose, Bld: 146 mg/dL — ABNORMAL HIGH (ref 70–99)
Potassium: 3.7 mmol/L (ref 3.5–5.1)
Sodium: 136 mmol/L (ref 135–145)

## 2018-09-29 LAB — POCT ACTIVATED CLOTTING TIME
Activated Clotting Time: 279 seconds
Activated Clotting Time: 335 seconds

## 2018-09-29 LAB — HEPARIN LEVEL (UNFRACTIONATED): Heparin Unfractionated: 0.48 IU/mL (ref 0.30–0.70)

## 2018-09-29 LAB — HEMOGLOBIN A1C
Hgb A1c MFr Bld: 6.8 % — ABNORMAL HIGH (ref 4.8–5.6)
Mean Plasma Glucose: 148.46 mg/dL

## 2018-09-29 LAB — TROPONIN I: Troponin I: 26.69 ng/mL (ref <0.03)

## 2018-09-29 SURGERY — LEFT HEART CATH
Anesthesia: Moderate Sedation

## 2018-09-29 MED ORDER — MIDAZOLAM HCL 2 MG/2ML IJ SOLN
INTRAMUSCULAR | Status: DC | PRN
  Administered 2018-09-29: 1 mg via INTRAVENOUS

## 2018-09-29 MED ORDER — CLOPIDOGREL BISULFATE 300 MG PO TABS
ORAL_TABLET | ORAL | Status: AC
  Filled 2018-09-29: qty 2

## 2018-09-29 MED ORDER — ENOXAPARIN SODIUM 40 MG/0.4ML ~~LOC~~ SOLN
40.0000 mg | SUBCUTANEOUS | Status: DC
  Administered 2018-09-29: 21:00:00 40 mg via SUBCUTANEOUS
  Filled 2018-09-29 (×2): qty 0.4

## 2018-09-29 MED ORDER — SODIUM CHLORIDE 0.9% FLUSH: 3.0000 mL | INTRAVENOUS | Status: DC | PRN

## 2018-09-29 MED ORDER — ACETAMINOPHEN 325 MG PO TABS: 650.0000 mg | ORAL_TABLET | ORAL | Status: DC | PRN

## 2018-09-29 MED ORDER — ONDANSETRON HCL 4 MG/2ML IJ SOLN: 4.0000 mg | Freq: Four times a day (QID) | INTRAMUSCULAR | Status: DC | PRN

## 2018-09-29 MED ORDER — IOHEXOL 300 MG/ML  SOLN
INTRAMUSCULAR | Status: DC | PRN
  Administered 2018-09-29: 09:00:00 100 mL via INTRAVENOUS

## 2018-09-29 MED ORDER — SODIUM CHLORIDE 0.9 % WEIGHT BASED INFUSION
1.0000 mL/kg/h | INTRAVENOUS | Status: AC
  Administered 2018-09-29 (×2): 1 mL/kg/h via INTRAVENOUS

## 2018-09-29 MED ORDER — CLOPIDOGREL BISULFATE 75 MG PO TABS
ORAL_TABLET | ORAL | Status: DC | PRN
  Administered 2018-09-29: 600 mg via ORAL

## 2018-09-29 MED ORDER — FENTANYL CITRATE (PF) 100 MCG/2ML IJ SOLN
INTRAMUSCULAR | Status: DC | PRN
  Administered 2018-09-29: 25 ug via INTRAVENOUS

## 2018-09-29 MED ORDER — HEPARIN SODIUM (PORCINE) 1000 UNIT/ML IJ SOLN
INTRAMUSCULAR | Status: AC
  Filled 2018-09-29: qty 1

## 2018-09-29 MED ORDER — SODIUM CHLORIDE 0.9 % IV SOLN: 250.0000 mL | INTRAVENOUS | Status: DC | PRN

## 2018-09-29 MED ORDER — ASPIRIN 81 MG PO CHEW
CHEWABLE_TABLET | ORAL | Status: AC
  Filled 2018-09-29: qty 1

## 2018-09-29 MED ORDER — FENTANYL CITRATE (PF) 100 MCG/2ML IJ SOLN
INTRAMUSCULAR | Status: AC
  Filled 2018-09-29: qty 2

## 2018-09-29 MED ORDER — ASPIRIN 81 MG PO CHEW
81.0000 mg | CHEWABLE_TABLET | Freq: Every day | ORAL | Status: DC
  Administered 2018-09-30: 81 mg via ORAL
  Filled 2018-09-29: qty 1

## 2018-09-29 MED ORDER — IOPAMIDOL (ISOVUE-300) INJECTION 61%
INTRAVENOUS | Status: DC | PRN
  Administered 2018-09-29: 100 mL via INTRA_ARTERIAL

## 2018-09-29 MED ORDER — HYDRALAZINE HCL 20 MG/ML IJ SOLN: 10.0000 mg | INTRAMUSCULAR | Status: AC | PRN

## 2018-09-29 MED ORDER — MIDAZOLAM HCL 2 MG/2ML IJ SOLN
INTRAMUSCULAR | Status: AC
  Filled 2018-09-29: qty 2

## 2018-09-29 MED ORDER — HEPARIN SODIUM (PORCINE) 1000 UNIT/ML IJ SOLN
INTRAMUSCULAR | Status: DC | PRN
  Administered 2018-09-29: 5000 [IU] via INTRAVENOUS
  Administered 2018-09-29: 3000 [IU] via INTRAVENOUS
  Administered 2018-09-29: 2500 [IU] via INTRAVENOUS

## 2018-09-29 MED ORDER — NITROGLYCERIN 5 MG/ML IV SOLN
INTRAVENOUS | Status: AC
  Filled 2018-09-29: qty 10

## 2018-09-29 MED ORDER — LABETALOL HCL 5 MG/ML IV SOLN: 10.0000 mg | INTRAVENOUS | Status: AC | PRN

## 2018-09-29 MED ORDER — VERAPAMIL HCL 2.5 MG/ML IV SOLN
INTRAVENOUS | Status: AC
  Filled 2018-09-29: qty 2

## 2018-09-29 MED ORDER — HEPARIN (PORCINE) IN NACL 1000-0.9 UT/500ML-% IV SOLN
INTRAVENOUS | Status: AC
  Filled 2018-09-29: qty 1000

## 2018-09-29 MED ORDER — CLOPIDOGREL BISULFATE 75 MG PO TABS
75.0000 mg | ORAL_TABLET | Freq: Every day | ORAL | Status: DC
  Administered 2018-09-30: 75 mg via ORAL
  Filled 2018-09-29: qty 1

## 2018-09-29 MED ORDER — SODIUM CHLORIDE 0.9% FLUSH
3.0000 mL | Freq: Two times a day (BID) | INTRAVENOUS | Status: DC
  Administered 2018-09-29 – 2018-09-30 (×2): 3 mL via INTRAVENOUS

## 2018-09-29 MED ORDER — VERAPAMIL HCL 2.5 MG/ML IV SOLN
INTRAVENOUS | Status: DC | PRN
  Administered 2018-09-29: 2.5 mg via INTRA_ARTERIAL

## 2018-09-29 MED ORDER — NITROGLYCERIN 1 MG/10 ML FOR IR/CATH LAB
INTRA_ARTERIAL | Status: DC | PRN
  Administered 2018-09-29 (×2): 200 ug

## 2018-09-29 SURGICAL SUPPLY — 19 items
BALLN MINITREK RX 2.0X12 (BALLOONS) ×3
BALLN TREK RX 2.25X20 (BALLOONS) ×3
BALLOON MINITREK RX 2.0X12 (BALLOONS) ×1 IMPLANT
BALLOON TREK RX 2.25X20 (BALLOONS) ×1 IMPLANT
CATH 5F 110X4 TIG (CATHETERS) ×3 IMPLANT
CATH INFINITI 5FR ANG PIGTAIL (CATHETERS) ×3 IMPLANT
CATH VISTA GUIDE 6FR JL3.5 (CATHETERS) ×3 IMPLANT
DEVICE INFLAT 30 PLUS (MISCELLANEOUS) ×3 IMPLANT
DEVICE RAD TR BAND REGULAR (VASCULAR PRODUCTS) ×3 IMPLANT
GLIDESHEATH SLEND SS 6F .021 (SHEATH) ×3 IMPLANT
GUIDELINER 6F (CATHETERS) ×3 IMPLANT
KIT MANI 3VAL PERCEP (MISCELLANEOUS) ×3 IMPLANT
NEEDLE PERC 18GX7CM (NEEDLE) IMPLANT
PACK CARDIAC CATH (CUSTOM PROCEDURE TRAY) ×3 IMPLANT
SHEATH AVANTI 6FR X 11CM (SHEATH) IMPLANT
STENT RESOLUTE ONYX 2.25X30 (Permanent Stent) ×3 IMPLANT
WIRE ASAHI PROWATER 180CM (WIRE) ×3 IMPLANT
WIRE GUIDERIGHT .035X150 (WIRE) IMPLANT
WIRE ROSEN-J .035X260CM (WIRE) ×3 IMPLANT

## 2018-09-29 NOTE — Progress Notes (Signed)
Pts stent card is in his chart and he will need to be given this upon discharge.

## 2018-09-29 NOTE — Progress Notes (Signed)
Dr. Nemiah Commander here to speak with pt. Re: procedural results. MD speaks Hindi. Pt. Denies any c/o CP, SOB, HA, N/V, dizziness, wrist discomfort post PCI per MD. Right wrist without any complications at present.

## 2018-09-29 NOTE — Progress Notes (Signed)
Spoke with pt. Pre cath via Interpretor [id number] . Reviewed cath procedure pre and post with pt. Pt. States MD spoke with pt. Yesterday re: procedure. Denies any c/o CP, SOB, N/V, dizziness, HA, CP.

## 2018-09-29 NOTE — Consult Note (Signed)
ANTICOAGULATION CONSULT NOTE - Initial Consult  Pharmacy Consult for Heparin Drip Indication: chest pain/ACS  Not on File  Patient Measurements: Height: 5\' 2"  (157.5 cm) Weight: 128 lb 14.4 oz (58.5 kg) IBW/kg (Calculated) : 54.6 Heparin Dosing Weight: 54.4kg  Vital Signs: Temp: 98.7 F (37.1 C) (04/09 0512) Temp Source: Oral (04/09 0512) BP: 119/59 (04/09 0512) Pulse Rate: 65 (04/09 0512)  Labs: Recent Labs    09/28/18 1251 09/28/18 1642 09/28/18 2214 09/29/18 0453  HGB 16.7  --   --  15.7  HCT 48.5  --   --  47.1  PLT 236  --   --  207  APTT 32  --   --   --   LABPROT 13.4  --   --   --   INR 1.0  --   --   --   HEPARINUNFRC  --   --  0.40 0.48  CREATININE 0.99  --   --   --   TROPONINI 6.78* 7.81* 14.87*  --     Estimated Creatinine Clearance: 52.9 mL/min (by C-G formula based on SCr of 0.99 mg/dL).   Medical History: History reviewed. No pertinent past medical history.  Medications:  No PTA medications.    Pt received labetalol 10mg  IV x 1, metoprolol 25mg , and ASA 324mg  in ED.  Assessment: Pharmacy has been consulted for heparin drip in this 73 yo male with ACS/STEMI.   Baseline INR, APTT, and CBC have been obtained.  Goal of Therapy:  Heparin level 0.3-0.7 units/ml Monitor platelets by anticoagulation protocol: Yes   Plan:  04/09 @ 0500 HL 0.48 therapeutic. Will continue current rate and will recheck w/ am labs.  Thomasene Ripple, PharmD, BCPS Clinical Pharmacist 09/29/2018 6:03 AM

## 2018-09-29 NOTE — Progress Notes (Signed)
Ch called son to provide support and to see if he had received an update on pt status. Pt was code STEMI last night. The son share that the pt had issues w/ chest pain and is unable to communicate in English (very limited). Ch was hoping to provided spiritual resources for the pt w/ the help of the son but the call was promptly ended due to the son receiving another call. F/u recomm.    09/29/18 0935  Clinical Encounter Type  Visited With Family  Visit Type Psychological support;Spiritual support;Social support  Spiritual Encounters  Spiritual Needs Emotional;Grief support  Stress Factors  Patient Stress Factors Major life changes;Health changes  Family Stress Factors Major life changes

## 2018-09-29 NOTE — Progress Notes (Signed)
Pt arrived back from cardiac cath at 1300. Right radial site assessed and dressing is C/D/I. Pt is A&Ox4. VSS. Pt has no complaints of pain at this time. Instructed pt not to move right arm, to keep it elevated, and to call if he starts having any pain at the site. Pt verbalized understanding of instructions. All communication done with video interpreter.

## 2018-09-29 NOTE — Progress Notes (Signed)
[employer] [facility]  [place] [facility]   PATIENT NAME: Duane Reeves    MR#:  [id number]  DATE OF BIRTH:  04/02/1946  SUBJECTIVE:  CHIEF COMPLAINT:   Chief Complaint  Patient presents with  . Chest Pain   -Status post cardiac cath today and drug-eluting stent placed to LAD -Three-vessel disease noted.  Patient is currently denying any complaints  REVIEW OF SYSTEMS:  Review of Systems  Constitutional: Negative for chills, fever and malaise/fatigue.  HENT: Negative for congestion, ear discharge, hearing loss and nosebleeds.   Eyes: Negative for blurred vision and double vision.  Respiratory: Negative for cough, shortness of breath and wheezing.   Cardiovascular: Negative for chest pain, palpitations and leg swelling.  Gastrointestinal: Negative for abdominal pain, constipation, diarrhea, nausea and vomiting.  Genitourinary: Negative for dysuria.  Musculoskeletal: Negative for myalgias.  Neurological: Negative for dizziness, focal weakness, seizures, weakness and headaches.    DRUG ALLERGIES:  Not on File  VITALS:  Blood pressure 129/66, pulse 72, temperature 98.3 F (36.8 C), resp. rate (!) 22, height 5\' 2"  (1.575 m), weight 58.5 kg, SpO2 94 %.  PHYSICAL EXAMINATION:  Physical Exam  GENERAL:  73 y.o.-year-old thin built patient lying in the bed with no acute distress.  EYES: Pupils equal, round, reactive to light and accommodation. No scleral icterus. Extraocular muscles intact.  HEENT: Head atraumatic, normocephalic. Oropharynx and nasopharynx clear.  NECK:  Supple, no jugular venous distention. No thyroid enlargement, no tenderness.  LUNGS: Normal breath sounds bilaterally, no wheezing, rales,rhonchi or crepitation. No use of accessory muscles of respiration.  CARDIOVASCULAR: S1, S2 normal. No murmurs, rubs, or gallops.  ABDOMEN: Soft, nontender, nondistended. Bowel sounds present. No organomegaly or mass.  EXTREMITIES: No pedal edema,  cyanosis, or clubbing.  NEUROLOGIC: Cranial nerves II through XII are intact. Muscle strength 5/5 in all extremities. Sensation intact. Gait not checked.  PSYCHIATRIC: The patient is alert and oriented x 3.  SKIN: No obvious rash, lesion, or ulcer.    LABORATORY PANEL:   CBC Recent Labs  Lab 09/29/18 0453  WBC 13.8*  HGB 15.7  HCT 47.1  PLT 207   ------------------------------------------------------------------------------------------------------------------  Chemistries  Recent Labs  Lab 09/28/18 1251 09/29/18 0453  NA 137 136  K 3.9 3.7  CL 99 99  CO2 26 24  GLUCOSE 145* 146*  BUN 12 14  CREATININE 0.99 1.01  CALCIUM 10.0 9.2  AST 117*  --   ALT 29  --   ALKPHOS 105  --   BILITOT 0.7  --    ------------------------------------------------------------------------------------------------------------------  Cardiac Enzymes Recent Labs  Lab 09/29/18 0453  TROPONINI 26.69*   ------------------------------------------------------------------------------------------------------------------  RADIOLOGY:  Dg Chest Portable 1 View  Result Date: 09/28/2018 CLINICAL DATA:  Chest pain, STEMI EXAM: PORTABLE CHEST 1 VIEW COMPARISON:  None. FINDINGS: Cardiac shadows within normal limits. The lungs are well aerated bilaterally. No focal infiltrate or effusion is seen. No acute bony abnormality is noted. IMPRESSION: No active disease. Electronically Signed   By: Alcide CleverMark  Lukens M.D.   On: 09/28/2018 13:43    EKG:   Orders placed or performed during the hospital encounter of 09/28/18  . EKG 12-Lead  . EKG 12-Lead  . EKG 12-Lead  . EKG 12-Lead  . EKG 12-Lead  . EKG 12-Lead  . EKG 12-Lead immediately post procedure  . EKG 12-Lead  . EKG 12-Lead immediately post procedure  . EKG 12-Lead  . EKG 12-Lead    ASSESSMENT AND PLAN:  73 year old male with no significant past medical history presents to hospital secondary to heartburn and chest pain.  1.STEMI- antero lateral  STEMI.  presented several hours after chest pain onset -Appreciate cardiology consult.  Patient was started on heparin drip. -Troponins were significantly elevated.  Echocardiogram is done and pending -Status post cardiac catheterization with three-vessel disease.  100% mid LAD was intervened and drug-eluting stent placed. -Will need a staged PCI for 80% occlusion of RCA at this time.  Will be done as outpatient. -Continue dual antiplatelet treatment with aspirin, Plavix.  Started on metoprolol and ACE inhibitor.  Also high intensity statin. -LDL is 129. -We will need cardiac rehab at discharge  2.  Hypertensive urgency on admission-improved -Started on losartan  3.  Ischemic cardiomyopathy-EF is 40% based on LV gram.  Apical akinesis noted -On aspirin, beta-blocker, ARB -Follow-up echocardiogram as outpatient  4.  Hyperlipidemia-on high intensity statin.  5.  DVT prophylaxis-we will start Lovenox  Encourage ambulation in a.m.   All the records are reviewed and case discussed with Care Management/Social Workerr. Management plans discussed with the patient, family and they are in agreement.  CODE STATUS: Full code  TOTAL TIME TAKING CARE OF THIS PATIENT: 38 minutes.   POSSIBLE D/C IN 1-2 DAYS, DEPENDING ON CLINICAL CONDITION.   Enid Baas M.D on 09/29/2018 at 11:14 AM  Between 7am to 6pm - Pager - [phone]  After 6pm go to [email or web address] - password [email or web address]  [facility]  Office  [phone]  CC: Primary care physician; Patient, No Pcp Per

## 2018-09-29 NOTE — Consult Note (Signed)
ANTICOAGULATION CONSULT NOTE - Initial Consult  Pharmacy Consult for Heparin Drip Indication: chest pain/ACS  Not on File  Patient Measurements: Height: 5\' 2"  (157.5 cm) Weight: 128 lb 14.4 oz (58.5 kg) IBW/kg (Calculated) : 54.6 Heparin Dosing Weight: 54.4kg  Vital Signs: Temp: 98.6 F (37 C) (04/08 1942) Temp Source: Oral (04/08 1942) BP: 151/92 (04/08 1942) Pulse Rate: 94 (04/08 1942)  Labs: Recent Labs    09/28/18 1251 09/28/18 1642 09/28/18 2214  HGB 16.7  --   --   HCT 48.5  --   --   PLT 236  --   --   APTT 32  --   --   LABPROT 13.4  --   --   INR 1.0  --   --   HEPARINUNFRC  --   --  0.40  CREATININE 0.99  --   --   TROPONINI 6.78* 7.81* 14.87*    Estimated Creatinine Clearance: 52.9 mL/min (by C-G formula based on SCr of 0.99 mg/dL).   Medical History: History reviewed. No pertinent past medical history.  Medications:  No PTA medications.    Pt received labetalol 10mg  IV x 1, metoprolol 25mg , and ASA 324mg  in ED.  Assessment: Pharmacy has been consulted for heparin drip in this 73 yo male with ACS/STEMI.   Baseline INR, APTT, and CBC have been obtained.  Goal of Therapy:  Heparin level 0.3-0.7 units/ml Monitor platelets by anticoagulation protocol: Yes   Plan:  04/08 @ 2200 HL 0.40 therapeutic. Will continue current rate and will recheck w/ am labs.  Thomasene Ripple, PharmD, BCPS Clinical Pharmacist 09/29/2018 3:54 AM

## 2018-09-30 ENCOUNTER — Encounter: Payer: Self-pay | Admitting: Cardiology

## 2018-09-30 LAB — BASIC METABOLIC PANEL
Anion gap: 9 (ref 5–15)
BUN: 14 mg/dL (ref 8–23)
CO2: 23 mmol/L (ref 22–32)
Calcium: 8.3 mg/dL — ABNORMAL LOW (ref 8.9–10.3)
Chloride: 102 mmol/L (ref 98–111)
Creatinine, Ser: 0.94 mg/dL (ref 0.61–1.24)
GFR calc Af Amer: >60 mL/min (ref >60)
GFR calc non Af Amer: >60 mL/min (ref >60)
Glucose, Bld: 121 mg/dL — ABNORMAL HIGH (ref 70–99)
Potassium: 3.6 mmol/L (ref 3.5–5.1)
Sodium: 134 mmol/L — ABNORMAL LOW (ref 135–145)

## 2018-09-30 LAB — CBC
HCT: 40 % (ref 39.0–52.0)
Hemoglobin: 13.6 g/dL (ref 13.0–17.0)
MCH: 28.7 pg (ref 26.0–34.0)
MCHC: 34 g/dL (ref 30.0–36.0)
MCV: 84.4 fL (ref 80.0–100.0)
Platelets: 167 10*3/uL (ref 150–400)
RBC: 4.74 MIL/uL (ref 4.22–5.81)
RDW: 13.7 % (ref 11.5–15.5)
WBC: 13.4 10*3/uL — ABNORMAL HIGH (ref 4.0–10.5)
nRBC: 0 % (ref 0.0–0.2)

## 2018-09-30 MED ORDER — ASPIRIN 81 MG PO TBEC: 81.0000 mg | DELAYED_RELEASE_TABLET | Freq: Every day | ORAL | 2 refills | Status: AC

## 2018-09-30 MED ORDER — ATORVASTATIN CALCIUM 80 MG PO TABS: 80.0000 mg | ORAL_TABLET | Freq: Every day | ORAL | 2 refills | Status: AC

## 2018-09-30 MED ORDER — METOPROLOL TARTRATE 100 MG PO TABS: 100.0000 mg | ORAL_TABLET | Freq: Two times a day (BID) | ORAL | 2 refills | Status: AC

## 2018-09-30 MED ORDER — NITROGLYCERIN 0.4 MG SL SUBL: 0.4000 mg | SUBLINGUAL_TABLET | SUBLINGUAL | 2 refills | Status: AC | PRN

## 2018-09-30 MED ORDER — CLOPIDOGREL BISULFATE 75 MG PO TABS: 75.0000 mg | ORAL_TABLET | Freq: Every day | ORAL | 2 refills | Status: AC

## 2018-09-30 MED ORDER — LOSARTAN POTASSIUM 50 MG PO TABS: 50.0000 mg | ORAL_TABLET | Freq: Every day | ORAL | 2 refills | Status: AC

## 2018-09-30 NOTE — Progress Notes (Signed)
[facility] Cardiology [facility] Encounter Note  Patient: Duane Reeves / Admit Date: 09/28/2018 / Date of Encounter: 09/30/2018, 10:26 AM   Subjective: Patient overall is slowly recovering from acute ST elevation myocardial infarction with occlusion of left anterior descending artery.  Patient has been on appropriate medication management for STEMI and is slowly improving with activities.  Heart rate is slightly elevated this morning at 100bpm although no evidence of congestive heart failure or anginal symptoms.  Patient has had an echocardiogram showing apical akinesis with ejection fraction of 40% as expected.  Troponin level improving  Review of Systems: Positive for: Shortness of breath Negative for: Vision change, hearing change, syncope, dizziness, nausea, vomiting,diarrhea, bloody stool, stomach pain, cough, congestion, diaphoresis, urinary frequency, urinary pain,skin lesions, skin rashes Others previously listed  Objective: Telemetry: Sinus rhythm Physical Exam: Blood pressure 113/67, pulse 92, temperature 98.6 F (37 C), temperature source Oral, resp. rate 20, height 5\' 2"  (1.575 m), weight 58.5 kg, SpO2 98 %. Body mass index is 23.58 kg/m. The remainder of the physical exam as per noted by prime doc  Intake/Output Summary (Last 24 hours) at 09/30/2018 1026 Last data filed at 09/30/2018 1010 Gross per 24 hour  Intake 1061 ml  Output 1025 ml  Net 36 ml    Inpatient Medications:  . alum & mag hydroxide-simeth  30 mL Oral Q6H  . aspirin  81 mg Oral Daily  . aspirin EC  81 mg Oral Daily  . atorvastatin  80 mg Oral q1800  . clopidogrel  75 mg Oral Q breakfast  . enoxaparin (LOVENOX) injection  40 mg Subcutaneous Q24H  . losartan  50 mg Oral Daily  . metoprolol tartrate  100 mg Oral BID  . pantoprazole  40 mg Oral Daily  . sodium chloride flush  3 mL Intravenous Q12H   Infusions:  . sodium chloride    . sodium chloride      Labs: Recent Labs   09/29/18 0453 09/30/18 0525  NA 136 134*  K 3.7 3.6  CL 99 102  CO2 24 23  GLUCOSE 146* 121*  BUN 14 14  CREATININE 1.01 0.94  CALCIUM 9.2 8.3*   Recent Labs    09/28/18 1251  AST 117*  ALT 29  ALKPHOS 105  BILITOT 0.7  PROT 9.5*  ALBUMIN 4.6   Recent Labs    09/28/18 1251 09/29/18 0453 09/30/18 0525  WBC 13.7* 13.8* 13.4*  NEUTROABS 10.7*  --   --   HGB 16.7 15.7 13.6  HCT 48.5 47.1 40.0  MCV 83.5 84.1 84.4  PLT 236 207 167   Recent Labs    09/28/18 1251 09/28/18 1642 09/28/18 2214 09/29/18 0453  TROPONINI 6.78* 7.81* 14.87* 26.69*   Invalid input(s): POCBNP Recent Labs    09/28/18 2214  HGBA1C 6.8*     Weights: Filed Weights   09/28/18 1244 09/28/18 1617 09/29/18 0726  Weight: 54.4 kg 58.5 kg 58.5 kg     Radiology/Studies:  Dg Chest Portable 1 View  Result Date: 09/28/2018 CLINICAL DATA:  Chest pain, STEMI EXAM: PORTABLE CHEST 1 VIEW COMPARISON:  None. FINDINGS: Cardiac shadows within normal limits. The lungs are well aerated bilaterally. No focal infiltrate or effusion is seen. No acute bony abnormality is noted. IMPRESSION: No active disease. Electronically Signed   By: Alcide Clever M.D.   On: 09/28/2018 13:43     Assessment and Recommendation  73 y.o. male with acute ST elevation myocardial infarction and occlusion of the left  anterior descending artery status post PCI and stent placement with moderate to severe right coronary artery stenosis as well and LV systolic dysfunction now improving 1.  Continue aspirin and Plavix or dual antiplatelet therapy for ST elevation myocardial infarction 2.  Beta-blocker and angiotensin receptor blocker for acute anterior apical myocardial infarction and would consider the possibility of increasing dosage of beta-blocker for better heart rate control to 60 to 80 bpm 3.  High intensity cholesterol therapy 4.  No further cardiac diagnostics at this time 5.  Okay for begin ambulation and cardiac rehabilitation.   If ambulation improving with no evidence of significant symptoms possible discharged home with follow-up next week for further adjustments of medication management and later intervention of right coronary artery  Signed, Arnoldo HookerBruce Miracle Criado M.D. FACC

## 2018-09-30 NOTE — Progress Notes (Addendum)
Ch met briefly w/ pt to provide  the [place] w/ the help of provider Dr. Barth Kirks who speaks Hindi fluently. Pt was appreciative of the care provided. Ch was thankful for the assistance.    09/30/18 1200  Clinical Encounter Type  Visited With Patient;Health care provider  Visit Type Spiritual support;Social support  Spiritual Encounters  Spiritual Needs Sacred text;Literature;Emotional  Stress Factors  Patient Stress Factors Health changes;Major life changes  Family Stress Factors Major life changes

## 2018-09-30 NOTE — Discharge Summary (Signed)
[facility] -  at [facility]   PATIENT NAME: Duane Reeves    MR#:  [id number]  DATE OF BIRTH:  [birth year]  DATE OF ADMISSION:  09/28/2018   ADMITTING PHYSICIAN: Auburn Bilberry, MD  DATE OF DISCHARGE: 09/30/2018 12:30 PM  PRIMARY CARE PHYSICIAN: Patient, No Pcp Per   ADMISSION DIAGNOSIS:   Acute coronary syndrome (HCC) [I24.9]  DISCHARGE DIAGNOSIS:   Active Problems:   Chest pain   SECONDARY DIAGNOSIS:   History reviewed. No pertinent past medical history.  HOSPITAL COURSE:   73 year old male with no significant past medical history presents to hospital secondary to heartburn and chest pain.  1.STEMI- antero lateral STEMI.  presented several hours after chest pain onset -Appreciate cardiology consult.  Patient was on heparin drip. -Troponins were significantly elevated.  Echocardiogram is done and showing apical and inferior hypokinesis with EF of 40% -Status post cardiac catheterization with three-vessel disease.  100% mid LAD-she was the culprit lesion and so it was intervened and drug-eluting stent placed. -Patient also had 80% occlusion of RCA, ideally would have been done this admission 2 days after fixing the culprit lesion.  However due to COVID-19 pandemic, all nonessential procedures are being rescheduled. -Will need a staged PCI for 80% occlusion of RCA at this time.  Will be done as outpatient. -Continue dual antiplatelet treatment with aspirin, Plavix.  Started on metoprolol and losartan.  Also high intensity statin. -LDL is 129. -We will need cardiac rehab at discharge  2.  Hypertensive urgency on admission-improved - on losartan and metoprolol  3.  Ischemic cardiomyopathy-EF is 40% based on LV gram.  Apical akinesis noted -On aspirin, beta-blocker, ARB -Follow-up echocardiogram as outpatient  4.  Hyperlipidemia-on high intensity statin.   Ambulating well   DISCHARGE CONDITIONS:   Guarded  CONSULTS  OBTAINED:   Treatment Team:  Marcina Millard, MD  DRUG ALLERGIES:   Not on File DISCHARGE MEDICATIONS:   Allergies as of 09/30/2018   Not on File     Medication List    TAKE these medications   aspirin 81 MG EC tablet Take 1 tablet (81 mg total) by mouth daily. Start taking on:  October 01, 2018   atorvastatin 80 MG tablet Commonly known as:  LIPITOR Take 1 tablet (80 mg total) by mouth daily at 6 PM.   clopidogrel 75 MG tablet Commonly known as:  PLAVIX Take 1 tablet (75 mg total) by mouth daily with breakfast. Start taking on:  October 01, 2018   losartan 50 MG tablet Commonly known as:  COZAAR Take 1 tablet (50 mg total) by mouth daily. Start taking on:  October 01, 2018   metoprolol tartrate 100 MG tablet Commonly known as:  LOPRESSOR Take 1 tablet (100 mg total) by mouth 2 (two) times daily.   nitroGLYCERIN 0.4 MG SL tablet Commonly known as:  NITROSTAT Place 1 tablet (0.4 mg total) under the tongue every 5 (five) minutes x 3 doses as needed for chest pain.        DISCHARGE INSTRUCTIONS:   1. PCP f/u in 1-2 weeks 2. Cardiology f/u in 1 week  DIET:   Cardiac diet  ACTIVITY:   Activity as tolerated  OXYGEN:   Home Oxygen: No.  Oxygen Delivery: room air  DISCHARGE LOCATION:   home   If you experience worsening of your admission symptoms, develop shortness of breath, life threatening emergency, suicidal or homicidal thoughts you must seek medical attention immediately by calling 911 or  calling your MD immediately  if symptoms less severe.  You Must read complete instructions/literature along with all the possible adverse reactions/side effects for all the Medicines you take and that have been prescribed to you. Take any new Medicines after you have completely understood and accpet all the possible adverse reactions/side effects.   Please note  You were cared for by a hospitalist during your hospital stay. If you have any questions about your  discharge medications or the care you received while you were in the hospital after you are discharged, you can call the unit and asked to speak with the hospitalist on call if the hospitalist that took care of you is not available. Once you are discharged, your primary care physician will handle any further medical issues. Please note that NO REFILLS for any discharge medications will be authorized once you are discharged, as it is imperative that you return to your primary care physician (or establish a relationship with a primary care physician if you do not have one) for your aftercare needs so that they can reassess your need for medications and monitor your lab values.    On the day of Discharge:  VITAL SIGNS:   Blood pressure 113/67, pulse 92, temperature 98.6 F (37 C), temperature source Oral, resp. rate 20, height $R [id number]$5\' 2"XAMINATION:   GENERAL:  73 y.o.-year-old thin built patient lying in the bed with no acute distress.  EYES: Pupils equal, round, reactive to light and accommodation. No scleral icterus. Extraocular muscles intact.  HEENT: Head atraumatic, normocephalic. Oropharynx and nasopharynx clear.  NECK:  Supple, no jugular venous distention. No thyroid enlargement, no tenderness.  LUNGS: Normal breath sounds bilaterally, no wheezing, rales,rhonchi or crepitation. No use of accessory muscles of respiration.  CARDIOVASCULAR: S1, S2 normal. No murmurs, rubs, or gallops.  ABDOMEN: Soft, nontender, nondistended. Bowel sounds present. No organomegaly or mass.  EXTREMITIES: No pedal edema, cyanosis, or clubbing. Right wrist with no bleeding at cath site. NEUROLOGIC: Cranial nerves II through XII are intact. Muscle strength 5/5 in all extremities. Sensation intact. Gait not checked.  PSYCHIATRIC: The patient is alert and oriented x 3.  SKIN: No obvious rash, lesion, or ulcer.    DATA REVIEW:   CBC Recent Labs  Lab 09/30/18 0525   WBC 13.4*  HGB 13.6  HCT 40.0  PLT 167    Chemistries  Recent Labs  Lab 09/28/18 1251  09/30/18 0525  NA 137   < > 134*  K 3.9   < > 3.6  CL 99   < > 102  CO2 26   < > 23  GLUCOSE 145*   < > 121*  BUN 12   < > 14  CREATININE 0.99   < > 0.94  CALCIUM 10.0   < > 8.3*  AST 117*  --   --   ALT 29  --   --   ALKPHOS 105  --   --   BILITOT 0.7  --   --    < > = values in this interval not displayed.     Microbiology Results  No results found for this or any previous visit.  RADIOLOGY:  No results found.   Management plans discussed with the patient, family and they are in agreement.  CODE STATUS:     Code Status Orders  (From admission, onward)         Start  Ordered   09/29/18 0930  Full code  Continuous     09/29/18 0930        Code Status History    Date Active Date Inactive Code Status Order ID Comments User Context   09/28/2018 1555 09/29/2018 0930 Full Code [id number]  Auburn Bilberry, MD Inpatient      TOTAL TIME TAKING CARE OF THIS PATIENT: 38 minutes.    Enid Baas M.D on 09/30/2018 at 2:14 PM  Between 7am to 6pm - Pager - [phone]  After 6pm go to [email or web address] - Social research officer, government  [facility]  Office  [phone]  CC: Primary care physician; Patient, No Pcp Per   Note: This dictation was prepared with Dragon dictation along with smaller phrase technology. Any transcriptional errors that result from this process are unintentional.

## 2018-09-30 NOTE — Care Management (Signed)
CM consult for cardiac rehab.  Notified Army Melia, RN cardiopulmonary nurse navigator.

## 2018-10-28 ENCOUNTER — Other Ambulatory Visit: Payer: Self-pay

## 2018-10-28 ENCOUNTER — Ambulatory Visit
Admission: RE | Admit: 2018-10-28 | Discharge: 2018-10-28 | Disposition: A | Discharge status: Home / Self Care | Payer: PRIVATE HEALTH INSURANCE | Source: Ambulatory Visit | Attending: Cardiology | Admitting: Cardiology

## 2018-10-28 DIAGNOSIS — Z1159 Encounter for screening for other viral diseases: Secondary | ICD-10-CM | POA: Insufficient documentation

## 2018-10-29 LAB — NOVEL CORONAVIRUS, NAA (HOSP ORDER, SEND-OUT TO REF LAB; TAT 18-24 HRS): SARS-CoV-2, NAA: NOT DETECTED

## 2018-10-31 MED ORDER — SODIUM CHLORIDE 0.9 % IV SOLN: 250.0000 mL | INTRAVENOUS | Status: DC | PRN

## 2018-10-31 MED ORDER — ASPIRIN 81 MG PO CHEW: 81.0000 mg | CHEWABLE_TABLET | ORAL | Status: DC

## 2018-10-31 MED ORDER — SODIUM CHLORIDE 0.9 % WEIGHT BASED INFUSION: 3.0000 mL/kg/h | INTRAVENOUS | Status: DC

## 2018-10-31 MED ORDER — SODIUM CHLORIDE 0.9% FLUSH
3.0000 mL | Freq: Two times a day (BID) | INTRAVENOUS | Status: DC
  Administered 2018-11-02: 3 mL via INTRAVENOUS

## 2018-10-31 MED ORDER — SODIUM CHLORIDE 0.9 % WEIGHT BASED INFUSION: 1.0000 mL/kg/h | INTRAVENOUS | Status: DC

## 2018-10-31 MED ORDER — SODIUM CHLORIDE 0.9% FLUSH: 3.0000 mL | INTRAVENOUS | Status: DC | PRN

## 2018-10-31 MED ORDER — ASPIRIN 81 MG PO CHEW
81.0000 mg | CHEWABLE_TABLET | ORAL | Status: AC
  Administered 2018-11-01: 81 mg via ORAL

## 2018-10-31 MED ORDER — SODIUM CHLORIDE 0.9% FLUSH: 3.0000 mL | Freq: Two times a day (BID) | INTRAVENOUS | Status: DC

## 2018-10-31 MED ORDER — SODIUM CHLORIDE 0.9 % IV SOLN
INTRAVENOUS | Status: DC
  Administered 2018-11-01: 08:00:00 via INTRAVENOUS

## 2018-11-01 ENCOUNTER — Observation Stay
Admission: RE | Admit: 2018-11-01 | Discharge: 2018-11-02 | Disposition: A | Discharge status: Home / Self Care | Payer: PRIVATE HEALTH INSURANCE | Attending: Cardiology | Admitting: Cardiology

## 2018-11-01 ENCOUNTER — Encounter
Admission: RE | Disposition: A | Discharge status: Home / Self Care | Payer: Self-pay | Source: Home / Self Care | Attending: Cardiology

## 2018-11-01 ENCOUNTER — Other Ambulatory Visit: Payer: Self-pay

## 2018-11-01 ENCOUNTER — Encounter: Payer: Self-pay | Admitting: *Deleted

## 2018-11-01 DIAGNOSIS — Z7902 Long term (current) use of antithrombotics/antiplatelets: Secondary | ICD-10-CM | POA: Diagnosis not present

## 2018-11-01 DIAGNOSIS — I255 Ischemic cardiomyopathy: Secondary | ICD-10-CM | POA: Insufficient documentation

## 2018-11-01 DIAGNOSIS — I213 ST elevation (STEMI) myocardial infarction of unspecified site: Secondary | ICD-10-CM | POA: Insufficient documentation

## 2018-11-01 DIAGNOSIS — E785 Hyperlipidemia, unspecified: Secondary | ICD-10-CM | POA: Insufficient documentation

## 2018-11-01 DIAGNOSIS — I25119 Atherosclerotic heart disease of native coronary artery with unspecified angina pectoris: Secondary | ICD-10-CM | POA: Diagnosis present

## 2018-11-01 DIAGNOSIS — E119 Type 2 diabetes mellitus without complications: Secondary | ICD-10-CM | POA: Diagnosis not present

## 2018-11-01 DIAGNOSIS — I2510 Atherosclerotic heart disease of native coronary artery without angina pectoris: Secondary | ICD-10-CM | POA: Diagnosis present

## 2018-11-01 DIAGNOSIS — Z79899 Other long term (current) drug therapy: Secondary | ICD-10-CM | POA: Diagnosis not present

## 2018-11-01 DIAGNOSIS — Z955 Presence of coronary angioplasty implant and graft: Secondary | ICD-10-CM | POA: Insufficient documentation

## 2018-11-01 DIAGNOSIS — Z1159 Encounter for screening for other viral diseases: Secondary | ICD-10-CM | POA: Insufficient documentation

## 2018-11-01 DIAGNOSIS — Z87891 Personal history of nicotine dependence: Secondary | ICD-10-CM | POA: Insufficient documentation

## 2018-11-01 HISTORY — PX: CORONARY STENT INTERVENTION: CATH118234

## 2018-11-01 LAB — POCT ACTIVATED CLOTTING TIME
Activated Clotting Time: 296 seconds
Activated Clotting Time: 318 seconds

## 2018-11-01 SURGERY — CORONARY STENT INTERVENTION
Anesthesia: Moderate Sedation

## 2018-11-01 MED ORDER — SODIUM CHLORIDE 0.9 % WEIGHT BASED INFUSION: 1.0000 mL/kg/h | INTRAVENOUS | Status: AC

## 2018-11-01 MED ORDER — SODIUM CHLORIDE 0.9 % IV SOLN: 250.0000 mL | INTRAVENOUS | Status: DC | PRN

## 2018-11-01 MED ORDER — IOHEXOL 300 MG/ML  SOLN
INTRAMUSCULAR | Status: DC | PRN
  Administered 2018-11-01: 09:00:00 90 mL via INTRA_ARTERIAL

## 2018-11-01 MED ORDER — VERAPAMIL HCL 2.5 MG/ML IV SOLN
INTRAVENOUS | Status: DC | PRN
  Administered 2018-11-01: 2.5 mg via INTRA_ARTERIAL

## 2018-11-01 MED ORDER — HYDRALAZINE HCL 20 MG/ML IJ SOLN: 10.0000 mg | INTRAMUSCULAR | Status: AC | PRN

## 2018-11-01 MED ORDER — HEPARIN (PORCINE) IN NACL 1000-0.9 UT/500ML-% IV SOLN
INTRAVENOUS | Status: AC
  Filled 2018-11-01: qty 1000

## 2018-11-01 MED ORDER — LOSARTAN POTASSIUM 50 MG PO TABS
50.0000 mg | ORAL_TABLET | Freq: Every day | ORAL | Status: DC
  Administered 2018-11-02: 50 mg via ORAL
  Filled 2018-11-01: qty 1

## 2018-11-01 MED ORDER — CLOPIDOGREL BISULFATE 75 MG PO TABS
ORAL_TABLET | ORAL | Status: DC | PRN
  Administered 2018-11-01: 300 mg via ORAL

## 2018-11-01 MED ORDER — CLOPIDOGREL BISULFATE 75 MG PO TABS
75.0000 mg | ORAL_TABLET | Freq: Every day | ORAL | Status: DC
  Administered 2018-11-02: 75 mg via ORAL
  Filled 2018-11-01: qty 1

## 2018-11-01 MED ORDER — LABETALOL HCL 5 MG/ML IV SOLN: 10.0000 mg | INTRAVENOUS | Status: AC | PRN

## 2018-11-01 MED ORDER — SODIUM CHLORIDE 0.9% FLUSH
3.0000 mL | Freq: Two times a day (BID) | INTRAVENOUS | Status: DC
  Administered 2018-11-01: 3 mL via INTRAVENOUS

## 2018-11-01 MED ORDER — HEPARIN SODIUM (PORCINE) 1000 UNIT/ML IJ SOLN
INTRAMUSCULAR | Status: AC
  Filled 2018-11-01: qty 1

## 2018-11-01 MED ORDER — HEPARIN SODIUM (PORCINE) 1000 UNIT/ML IJ SOLN
INTRAMUSCULAR | Status: DC | PRN
  Administered 2018-11-01: 6000 [IU] via INTRAVENOUS
  Administered 2018-11-01: 2500 [IU] via INTRAVENOUS

## 2018-11-01 MED ORDER — NITROGLYCERIN 1 MG/10 ML FOR IR/CATH LAB
INTRA_ARTERIAL | Status: AC
  Filled 2018-11-01: qty 10

## 2018-11-01 MED ORDER — ATORVASTATIN CALCIUM 80 MG PO TABS
80.0000 mg | ORAL_TABLET | Freq: Every day | ORAL | Status: DC
  Administered 2018-11-01: 80 mg via ORAL
  Filled 2018-11-01: qty 4
  Filled 2018-11-01 (×2): qty 1

## 2018-11-01 MED ORDER — ASPIRIN 81 MG PO CHEW
81.0000 mg | CHEWABLE_TABLET | Freq: Every day | ORAL | Status: DC
  Administered 2018-11-01 – 2018-11-02 (×2): 81 mg via ORAL
  Filled 2018-11-01 (×2): qty 1

## 2018-11-01 MED ORDER — MIDAZOLAM HCL 2 MG/2ML IJ SOLN
INTRAMUSCULAR | Status: AC
  Filled 2018-11-01: qty 2

## 2018-11-01 MED ORDER — METOPROLOL TARTRATE 50 MG PO TABS
100.0000 mg | ORAL_TABLET | Freq: Two times a day (BID) | ORAL | Status: DC
  Administered 2018-11-02: 100 mg via ORAL
  Filled 2018-11-01: qty 2

## 2018-11-01 MED ORDER — ONDANSETRON HCL 4 MG/2ML IJ SOLN: 4.0000 mg | Freq: Four times a day (QID) | INTRAMUSCULAR | Status: DC | PRN

## 2018-11-01 MED ORDER — FENTANYL CITRATE (PF) 100 MCG/2ML IJ SOLN
INTRAMUSCULAR | Status: DC | PRN
  Administered 2018-11-01: 25 ug via INTRAVENOUS

## 2018-11-01 MED ORDER — SODIUM CHLORIDE 0.9% FLUSH: 3.0000 mL | INTRAVENOUS | Status: DC | PRN

## 2018-11-01 MED ORDER — CLOPIDOGREL BISULFATE 75 MG PO TABS
ORAL_TABLET | ORAL | Status: AC
  Filled 2018-11-01: qty 4

## 2018-11-01 MED ORDER — NITROGLYCERIN 1 MG/10 ML FOR IR/CATH LAB
INTRA_ARTERIAL | Status: DC | PRN
  Administered 2018-11-01: 200 ug via INTRACORONARY

## 2018-11-01 MED ORDER — VERAPAMIL HCL 2.5 MG/ML IV SOLN
INTRAVENOUS | Status: AC
  Filled 2018-11-01: qty 2

## 2018-11-01 MED ORDER — FENTANYL CITRATE (PF) 100 MCG/2ML IJ SOLN
INTRAMUSCULAR | Status: AC
  Filled 2018-11-01: qty 2

## 2018-11-01 MED ORDER — ACETAMINOPHEN 325 MG PO TABS: 650.0000 mg | ORAL_TABLET | ORAL | Status: DC | PRN

## 2018-11-01 MED ORDER — ASPIRIN 81 MG PO CHEW
CHEWABLE_TABLET | ORAL | Status: AC
  Administered 2018-11-01: 81 mg via ORAL
  Filled 2018-11-01: qty 1

## 2018-11-01 MED ORDER — MIDAZOLAM HCL 2 MG/2ML IJ SOLN
INTRAMUSCULAR | Status: DC | PRN
  Administered 2018-11-01: 1 mg via INTRAVENOUS

## 2018-11-01 SURGICAL SUPPLY — 12 items
BALLN TREK RX 2.5X15 (BALLOONS) ×3
BALLOON TREK RX 2.5X15 (BALLOONS) ×1 IMPLANT
CATH INFINITI 5 FR JL3.5 (CATHETERS) ×3 IMPLANT
CATH VISTA GUIDE 6FR JR4 SH (CATHETERS) ×3 IMPLANT
DEVICE INFLAT 30 PLUS (MISCELLANEOUS) ×3 IMPLANT
DEVICE RAD TR BAND REGULAR (VASCULAR PRODUCTS) ×3 IMPLANT
GLIDESHEATH SLEND SS 6F .021 (SHEATH) ×3 IMPLANT
KIT MANI 3VAL PERCEP (MISCELLANEOUS) ×3 IMPLANT
PACK CARDIAC CATH (CUSTOM PROCEDURE TRAY) ×3 IMPLANT
STENT RESOLUTE ONYX 2.75X18 (Permanent Stent) ×3 IMPLANT
WIRE ASAHI PROWATER 180CM (WIRE) ×3 IMPLANT
WIRE ROSEN-J .035X260CM (WIRE) ×3 IMPLANT

## 2018-11-01 NOTE — Progress Notes (Signed)
Pt post Cardiac Cath. Son called and updated on Pt status. Updated on room number once transferred to floor. Will update as needed this morning.

## 2018-11-01 NOTE — Progress Notes (Signed)
Interpreter services used for Pre and Post op work up. Interpreter ID  [id number]

## 2018-11-01 NOTE — Progress Notes (Signed)
Pt transferred to room 235. Pt son called and updated, provided 2A floor phone  number and room telephone number.

## 2018-11-02 DIAGNOSIS — Z955 Presence of coronary angioplasty implant and graft: Secondary | ICD-10-CM

## 2018-11-02 DIAGNOSIS — I25119 Atherosclerotic heart disease of native coronary artery with unspecified angina pectoris: Secondary | ICD-10-CM | POA: Diagnosis not present

## 2018-11-02 LAB — BASIC METABOLIC PANEL
Anion gap: 9 (ref 5–15)
BUN: 12 mg/dL (ref 8–23)
CO2: 25 mmol/L (ref 22–32)
Calcium: 8.8 mg/dL — ABNORMAL LOW (ref 8.9–10.3)
Chloride: 102 mmol/L (ref 98–111)
Creatinine, Ser: 0.89 mg/dL (ref 0.61–1.24)
GFR calc Af Amer: >60 mL/min (ref >60)
GFR calc non Af Amer: >60 mL/min (ref >60)
Glucose, Bld: 112 mg/dL — ABNORMAL HIGH (ref 70–99)
Potassium: 3.6 mmol/L (ref 3.5–5.1)
Sodium: 136 mmol/L (ref 135–145)

## 2018-11-02 LAB — CBC
HCT: 38.5 % — ABNORMAL LOW (ref 39.0–52.0)
Hemoglobin: 12.7 g/dL — ABNORMAL LOW (ref 13.0–17.0)
MCH: 28.3 pg (ref 26.0–34.0)
MCHC: 33 g/dL (ref 30.0–36.0)
MCV: 85.7 fL (ref 80.0–100.0)
Platelets: 140 10*3/uL — ABNORMAL LOW (ref 150–400)
RBC: 4.49 MIL/uL (ref 4.22–5.81)
RDW: 13.4 % (ref 11.5–15.5)
WBC: 8.1 10*3/uL (ref 4.0–10.5)
nRBC: 0 % (ref 0.0–0.2)

## 2018-11-02 NOTE — Progress Notes (Signed)
Pt to be discharged today. Iv's and tele removed. disch instructions given to pt's on. No new prescrips. disch via w.c. to awaiting car

## 2018-11-02 NOTE — Discharge Summary (Signed)
Physician Discharge Summary  Patient ID: Duane Reeves MRN: [id number] DOB/AGE: [birth year] 73 y.o.  Admit date: 11/01/2018 Discharge date: 11/02/2018  Primary Discharge Diagnosis chest pain Secondary Discharge Diagnosis coronary artery disease  Significant Diagnostic Studies: yes  Consults: None  Hospital Course: The patient is a 73 year old gentleman who went elective percutaneous coronary intervention on 11/01/2018.  The patient received drug-eluting stent in the ostial/proximal RCA without complication.  Patient was returned to telemetry where he had a non-complicated hospital course without recurrent chest pain.  On the morning of 11/02/2018 the patient was ambulating without difficulty and was discharged home in stable condition.   Discharge Exam: Blood pressure 107/65, pulse (!) 58, temperature 98.8 F (37.1 C), temperature source Oral, resp. rate 20, height 5\' 2"  (1.575 m), weight 61.8 kg, SpO2 98 %.   General appearance: alert Head: Normocephalic, without obvious abnormality, atraumatic Eyes: conjunctivae/corneas clear. PERRL, EOM's intact. Fundi benign. Ears: normal TM's and external ear canals both ears Nose: Nares normal. Septum midline. Mucosa normal. No drainage or sinus tenderness. Throat: lips, mucosa, and tongue normal; teeth and gums normal Neck: no adenopathy, no carotid bruit, no JVD, supple, symmetrical, trachea midline and thyroid not enlarged, symmetric, no tenderness/mass/nodules Back: symmetric, no curvature. ROM normal. No CVA tenderness. Resp: clear to auscultation bilaterally Chest wall: no tenderness Cardio: regular rate and rhythm, S1, S2 normal, no murmur, click, rub or gallop GI: soft, non-tender; bowel sounds normal; no masses,  no organomegaly Extremities: extremities normal, atraumatic, no cyanosis or edema Pulses: 2+ and symmetric Skin: Skin color, texture, turgor normal. No rashes or lesions Lymph nodes: Cervical, supraclavicular,  and axillary nodes normal. Neurologic: Grossly normal Labs:   Lab Results  Component Value Date   WBC 8.1 11/02/2018   HGB 12.7 (L) 11/02/2018   HCT 38.5 (L) 11/02/2018   MCV 85.7 11/02/2018   PLT 140 (L) 11/02/2018    Recent Labs  Lab 11/02/18 0256  NA 136  K 3.6  CL 102  CO2 25  BUN 12  CREATININE 0.89  CALCIUM 8.8*  GLUCOSE 112*      Radiology:  EKG: Sinus rhythm  FOLLOW UP PLANS AND APPOINTMENTS Discharge Instructions    AMB Referral to Cardiac Rehabilitation - Phase II   Complete by:  As directed    Diagnosis:  Coronary Stents   After initial evaluation and assessments completed: Virtual Based Care may be provided alone or in conjunction with Phase 2 Cardiac Rehab based on patient barriers.:  Yes     Allergies as of 11/02/2018   No Known Allergies     Medication List    TAKE these medications   aspirin 81 MG EC tablet Take 1 tablet (81 mg total) by mouth daily.   atorvastatin 80 MG tablet Commonly known as:  LIPITOR Take 1 tablet (80 mg total) by mouth daily at 6 PM.   clopidogrel 75 MG tablet Commonly known as:  PLAVIX Take 1 tablet (75 mg total) by mouth daily with breakfast.   losartan 50 MG tablet Commonly known as:  COZAAR Take 1 tablet (50 mg total) by mouth daily.   metoprolol tartrate 100 MG tablet Commonly known as:  LOPRESSOR Take 1 tablet (100 mg total) by mouth 2 (two) times daily.   nitroGLYCERIN 0.4 MG SL tablet Commonly known as:  NITROSTAT Place 1 tablet (0.4 mg total) under the tongue every 5 (five) minutes x 3 doses as needed for chest pain.      Follow-up Information  [facility] Follow up.   Specialty:  Cardiac Rehabilitation Why:  Your Cardiologist has referred you to outpatient [facility] at [facility].  The Cardiac Rehab dept will contact you as soon as the dept reopens.  (The Cardiac Rehab dept is temprorarily closed due to the COVID-19 pandemic.)   Contact information: [id number] [place] [id number] [phone]          BRING ALL MEDICATIONS WITH YOU TO FOLLOW UP APPOINTMENTS  Time spent with patient to include physician time: 25 min Signed:  Marcina MillardAlexander Arel Tippen MD, PhD, [facility] 11/02/2018, 7:57 AM

## 2019-09-08 IMAGING — DX PORTABLE CHEST - 1 VIEW
1 series · 1 of 1 positions shown · non-contrast
Comparison: None.

CLINICAL DATA: Chest pain, STEMI

EXAM:
PORTABLE CHEST 1 VIEW

[chest ap]
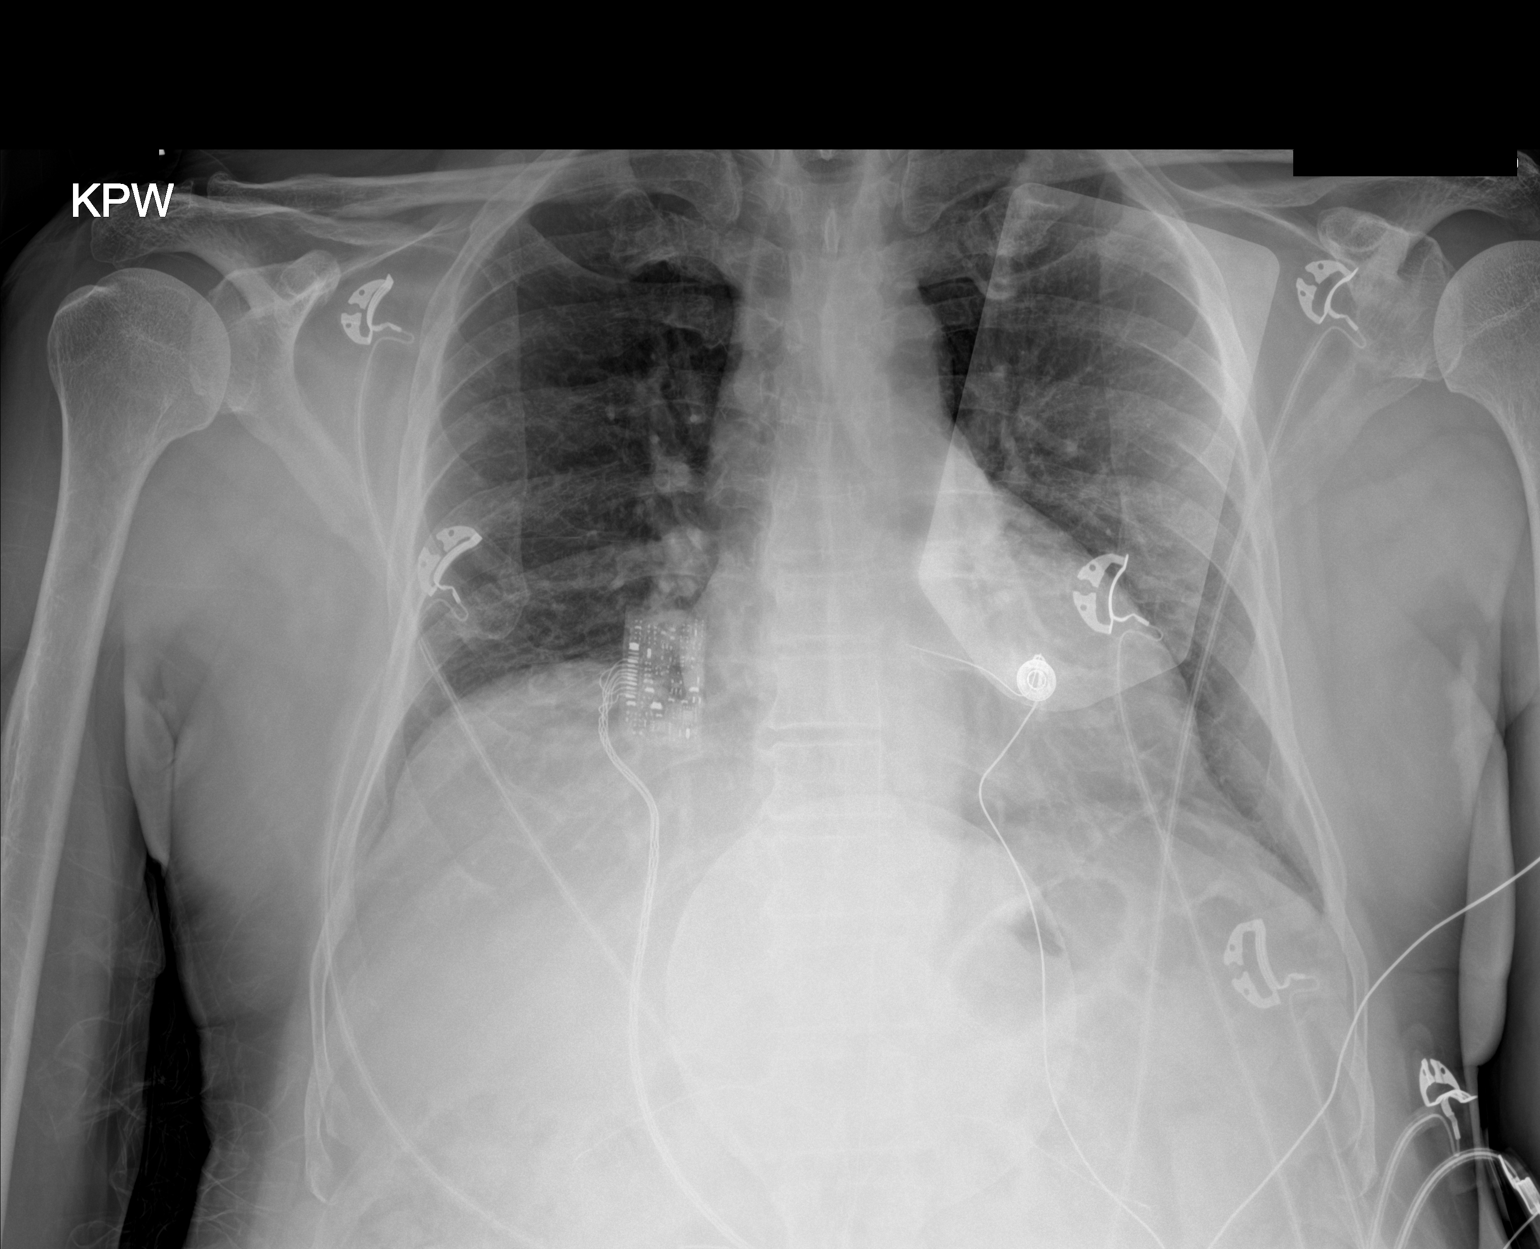

[1 of 1 positions shown; findings below may reference images not displayed]

FINDINGS: Cardiac shadows within normal limits. The lungs are well aerated
bilaterally. No focal infiltrate or effusion is seen. No acute bony
abnormality is noted.
IMPRESSION: No active disease.
